# Patient Record
Sex: Female | Born: 1970 | Race: Black or African American | Hispanic: No | Marital: Single | Smoking: Former smoker
Health system: Southern US, Community
[De-identification: ages and names within clinical notes are randomized; demographics above are authoritative.]

## PROBLEM LIST (undated history)

## (undated) DIAGNOSIS — N920 Excessive and frequent menstruation with regular cycle: Secondary | ICD-10-CM

## (undated) HISTORY — PX: WISDOM TOOTH EXTRACTION: SHX21

## (undated) HISTORY — PX: TONSILLECTOMY: SUR1361

## (undated) HISTORY — PX: KNEE SURGERY: SHX244

## (undated) HISTORY — PX: TUBAL LIGATION: SHX77

---

## 1998-10-18 ENCOUNTER — Emergency Department (HOSPITAL_COMMUNITY): Admission: EM | Admit: 1998-10-18 | Discharge: 1998-10-18 | Payer: Self-pay | Admitting: Emergency Medicine

## 2000-03-28 ENCOUNTER — Encounter: Payer: Self-pay | Admitting: Family Medicine

## 2000-03-28 ENCOUNTER — Encounter: Admission: RE | Admit: 2000-03-28 | Discharge: 2000-03-28 | Payer: Self-pay | Admitting: Family Medicine

## 2000-05-02 ENCOUNTER — Emergency Department (HOSPITAL_COMMUNITY): Admission: EM | Admit: 2000-05-02 | Discharge: 2000-05-02 | Payer: Self-pay | Admitting: Emergency Medicine

## 2001-01-08 ENCOUNTER — Ambulatory Visit (HOSPITAL_COMMUNITY): Admission: RE | Admit: 2001-01-08 | Discharge: 2001-01-08 | Payer: Self-pay | Admitting: *Deleted

## 2001-02-09 ENCOUNTER — Ambulatory Visit (HOSPITAL_COMMUNITY): Admission: RE | Admit: 2001-02-09 | Discharge: 2001-02-09 | Payer: Self-pay | Admitting: *Deleted

## 2001-05-02 ENCOUNTER — Inpatient Hospital Stay (HOSPITAL_COMMUNITY): Admission: AD | Admit: 2001-05-02 | Discharge: 2001-05-13 | Payer: Self-pay | Admitting: *Deleted

## 2001-05-03 ENCOUNTER — Encounter: Payer: Self-pay | Admitting: *Deleted

## 2001-05-06 ENCOUNTER — Encounter: Payer: Self-pay | Admitting: Obstetrics

## 2001-05-13 ENCOUNTER — Encounter: Payer: Self-pay | Admitting: Obstetrics

## 2001-05-15 ENCOUNTER — Inpatient Hospital Stay (HOSPITAL_COMMUNITY): Admission: AD | Admit: 2001-05-15 | Discharge: 2001-05-15 | Payer: Self-pay | Admitting: *Deleted

## 2001-05-21 ENCOUNTER — Encounter (HOSPITAL_COMMUNITY): Admission: RE | Admit: 2001-05-21 | Discharge: 2001-06-20 | Payer: Self-pay | Admitting: Obstetrics & Gynecology

## 2001-05-23 ENCOUNTER — Encounter: Admission: RE | Admit: 2001-05-23 | Discharge: 2001-05-23 | Payer: Self-pay | Admitting: Obstetrics & Gynecology

## 2001-05-31 ENCOUNTER — Encounter: Admission: RE | Admit: 2001-05-31 | Discharge: 2001-05-31 | Payer: Self-pay | Admitting: Obstetrics

## 2001-06-04 ENCOUNTER — Inpatient Hospital Stay (HOSPITAL_COMMUNITY): Admission: AD | Admit: 2001-06-04 | Discharge: 2001-06-04 | Payer: Self-pay | Admitting: Obstetrics & Gynecology

## 2001-06-16 ENCOUNTER — Encounter: Payer: Self-pay | Admitting: Obstetrics & Gynecology

## 2001-06-16 ENCOUNTER — Inpatient Hospital Stay (HOSPITAL_COMMUNITY): Admission: AD | Admit: 2001-06-16 | Discharge: 2001-06-16 | Payer: Self-pay | Admitting: Obstetrics & Gynecology

## 2001-06-18 ENCOUNTER — Encounter: Payer: Self-pay | Admitting: Obstetrics & Gynecology

## 2001-06-21 ENCOUNTER — Inpatient Hospital Stay (HOSPITAL_COMMUNITY): Admission: AD | Admit: 2001-06-21 | Discharge: 2001-06-21 | Payer: Self-pay | Admitting: Obstetrics

## 2001-06-23 ENCOUNTER — Inpatient Hospital Stay (HOSPITAL_COMMUNITY): Admission: AD | Admit: 2001-06-23 | Discharge: 2001-06-23 | Payer: Self-pay | Admitting: *Deleted

## 2001-06-25 ENCOUNTER — Inpatient Hospital Stay (HOSPITAL_COMMUNITY): Admission: AD | Admit: 2001-06-25 | Discharge: 2001-06-27 | Payer: Self-pay | Admitting: Obstetrics

## 2001-06-25 ENCOUNTER — Inpatient Hospital Stay (HOSPITAL_COMMUNITY): Admission: AD | Admit: 2001-06-25 | Discharge: 2001-06-25 | Payer: Self-pay | Admitting: *Deleted

## 2001-06-25 ENCOUNTER — Encounter: Payer: Self-pay | Admitting: *Deleted

## 2001-06-25 ENCOUNTER — Encounter (INDEPENDENT_AMBULATORY_CARE_PROVIDER_SITE_OTHER): Payer: Self-pay

## 2001-12-19 ENCOUNTER — Emergency Department (HOSPITAL_COMMUNITY): Admission: EM | Admit: 2001-12-19 | Discharge: 2001-12-20 | Payer: Self-pay | Admitting: Emergency Medicine

## 2002-01-15 ENCOUNTER — Emergency Department (HOSPITAL_COMMUNITY): Admission: EM | Admit: 2002-01-15 | Discharge: 2002-01-15 | Payer: Self-pay | Admitting: Emergency Medicine

## 2002-03-13 ENCOUNTER — Emergency Department (HOSPITAL_COMMUNITY): Admission: EM | Admit: 2002-03-13 | Discharge: 2002-03-13 | Payer: Self-pay | Admitting: Emergency Medicine

## 2003-03-10 ENCOUNTER — Emergency Department (HOSPITAL_COMMUNITY): Admission: EM | Admit: 2003-03-10 | Discharge: 2003-03-10 | Payer: Self-pay | Admitting: Emergency Medicine

## 2003-03-10 ENCOUNTER — Encounter: Payer: Self-pay | Admitting: Emergency Medicine

## 2003-07-09 ENCOUNTER — Emergency Department (HOSPITAL_COMMUNITY): Admission: EM | Admit: 2003-07-09 | Discharge: 2003-07-09 | Payer: Self-pay | Admitting: Emergency Medicine

## 2003-08-15 ENCOUNTER — Emergency Department (HOSPITAL_COMMUNITY): Admission: EM | Admit: 2003-08-15 | Discharge: 2003-08-15 | Payer: Self-pay | Admitting: Emergency Medicine

## 2003-09-11 ENCOUNTER — Emergency Department (HOSPITAL_COMMUNITY): Admission: EM | Admit: 2003-09-11 | Discharge: 2003-09-11 | Payer: Self-pay | Admitting: Emergency Medicine

## 2003-09-30 ENCOUNTER — Emergency Department (HOSPITAL_COMMUNITY): Admission: EM | Admit: 2003-09-30 | Discharge: 2003-10-01 | Payer: Self-pay | Admitting: Emergency Medicine

## 2003-11-14 ENCOUNTER — Emergency Department (HOSPITAL_COMMUNITY): Admission: EM | Admit: 2003-11-14 | Discharge: 2003-11-15 | Payer: Self-pay | Admitting: *Deleted

## 2003-11-27 ENCOUNTER — Ambulatory Visit (HOSPITAL_COMMUNITY): Admission: RE | Admit: 2003-11-27 | Discharge: 2003-11-27 | Payer: Self-pay | Admitting: Sports Medicine

## 2004-03-23 ENCOUNTER — Encounter: Admission: RE | Admit: 2004-03-23 | Discharge: 2004-05-05 | Payer: Self-pay | Admitting: Orthopedic Surgery

## 2004-03-29 ENCOUNTER — Emergency Department (HOSPITAL_COMMUNITY): Admission: EM | Admit: 2004-03-29 | Discharge: 2004-03-30 | Payer: Self-pay | Admitting: Emergency Medicine

## 2004-09-20 ENCOUNTER — Inpatient Hospital Stay (HOSPITAL_COMMUNITY): Admission: AD | Admit: 2004-09-20 | Discharge: 2004-09-20 | Payer: Self-pay | Admitting: Obstetrics & Gynecology

## 2004-12-06 ENCOUNTER — Emergency Department (HOSPITAL_COMMUNITY): Admission: EM | Admit: 2004-12-06 | Discharge: 2004-12-07 | Payer: Self-pay | Admitting: Emergency Medicine

## 2005-03-04 ENCOUNTER — Observation Stay (HOSPITAL_COMMUNITY): Admission: AD | Admit: 2005-03-04 | Discharge: 2005-03-05 | Payer: Self-pay | Admitting: Obstetrics

## 2005-03-07 ENCOUNTER — Emergency Department (HOSPITAL_COMMUNITY): Admission: EM | Admit: 2005-03-07 | Discharge: 2005-03-07 | Payer: Self-pay | Admitting: Emergency Medicine

## 2005-06-16 ENCOUNTER — Emergency Department (HOSPITAL_COMMUNITY): Admission: EM | Admit: 2005-06-16 | Discharge: 2005-06-16 | Payer: Self-pay | Admitting: Family Medicine

## 2005-06-18 ENCOUNTER — Emergency Department (HOSPITAL_COMMUNITY): Admission: EM | Admit: 2005-06-18 | Discharge: 2005-06-18 | Payer: Self-pay | Admitting: Family Medicine

## 2005-07-07 ENCOUNTER — Emergency Department (HOSPITAL_COMMUNITY): Admission: EM | Admit: 2005-07-07 | Discharge: 2005-07-07 | Payer: Self-pay | Admitting: Emergency Medicine

## 2005-07-17 ENCOUNTER — Emergency Department (HOSPITAL_COMMUNITY): Admission: EM | Admit: 2005-07-17 | Discharge: 2005-07-17 | Payer: Self-pay | Admitting: Family Medicine

## 2005-08-25 ENCOUNTER — Emergency Department (HOSPITAL_COMMUNITY): Admission: EM | Admit: 2005-08-25 | Discharge: 2005-08-26 | Payer: Self-pay | Admitting: *Deleted

## 2005-09-17 ENCOUNTER — Emergency Department (HOSPITAL_COMMUNITY): Admission: EM | Admit: 2005-09-17 | Discharge: 2005-09-17 | Payer: Self-pay | Admitting: Family Medicine

## 2005-10-18 ENCOUNTER — Ambulatory Visit (HOSPITAL_COMMUNITY): Admission: RE | Admit: 2005-10-18 | Discharge: 2005-10-18 | Payer: Self-pay | Admitting: Obstetrics

## 2005-11-16 ENCOUNTER — Emergency Department (HOSPITAL_COMMUNITY): Admission: EM | Admit: 2005-11-16 | Discharge: 2005-11-16 | Payer: Self-pay | Admitting: Family Medicine

## 2006-02-05 ENCOUNTER — Emergency Department (HOSPITAL_COMMUNITY): Admission: EM | Admit: 2006-02-05 | Discharge: 2006-02-05 | Payer: Self-pay | Admitting: Emergency Medicine

## 2006-04-18 ENCOUNTER — Inpatient Hospital Stay (HOSPITAL_COMMUNITY): Admission: AD | Admit: 2006-04-18 | Discharge: 2006-04-18 | Payer: Self-pay | Admitting: Obstetrics & Gynecology

## 2006-05-18 ENCOUNTER — Ambulatory Visit (HOSPITAL_COMMUNITY): Admission: RE | Admit: 2006-05-18 | Discharge: 2006-05-18 | Payer: Self-pay | Admitting: Obstetrics

## 2006-07-21 ENCOUNTER — Emergency Department (HOSPITAL_COMMUNITY): Admission: EM | Admit: 2006-07-21 | Discharge: 2006-07-21 | Payer: Self-pay | Admitting: Family Medicine

## 2006-09-18 ENCOUNTER — Ambulatory Visit (HOSPITAL_COMMUNITY): Admission: RE | Admit: 2006-09-18 | Discharge: 2006-09-18 | Payer: Self-pay | Admitting: Obstetrics

## 2006-10-09 ENCOUNTER — Emergency Department (HOSPITAL_COMMUNITY): Admission: EM | Admit: 2006-10-09 | Discharge: 2006-10-09 | Payer: Self-pay | Admitting: Family Medicine

## 2006-12-04 ENCOUNTER — Ambulatory Visit (HOSPITAL_COMMUNITY): Admission: RE | Admit: 2006-12-04 | Discharge: 2006-12-04 | Payer: Self-pay | Admitting: Obstetrics

## 2007-04-03 ENCOUNTER — Ambulatory Visit (HOSPITAL_COMMUNITY): Admission: RE | Admit: 2007-04-03 | Discharge: 2007-04-03 | Payer: Self-pay | Admitting: Obstetrics

## 2007-04-16 ENCOUNTER — Encounter: Admission: RE | Admit: 2007-04-16 | Discharge: 2007-04-16 | Payer: Self-pay | Admitting: Obstetrics

## 2007-04-28 ENCOUNTER — Emergency Department (HOSPITAL_COMMUNITY): Admission: EM | Admit: 2007-04-28 | Discharge: 2007-04-28 | Payer: Self-pay | Admitting: Family Medicine

## 2007-05-01 ENCOUNTER — Emergency Department (HOSPITAL_COMMUNITY): Admission: EM | Admit: 2007-05-01 | Discharge: 2007-05-01 | Payer: Self-pay | Admitting: Emergency Medicine

## 2007-06-02 ENCOUNTER — Emergency Department (HOSPITAL_COMMUNITY): Admission: EM | Admit: 2007-06-02 | Discharge: 2007-06-02 | Payer: Self-pay | Admitting: Emergency Medicine

## 2007-06-26 ENCOUNTER — Emergency Department (HOSPITAL_COMMUNITY): Admission: EM | Admit: 2007-06-26 | Discharge: 2007-06-26 | Payer: Self-pay | Admitting: Emergency Medicine

## 2007-07-18 ENCOUNTER — Emergency Department (HOSPITAL_COMMUNITY): Admission: EM | Admit: 2007-07-18 | Discharge: 2007-07-18 | Payer: Self-pay | Admitting: Emergency Medicine

## 2007-08-03 ENCOUNTER — Emergency Department (HOSPITAL_COMMUNITY): Admission: EM | Admit: 2007-08-03 | Discharge: 2007-08-03 | Payer: Self-pay | Admitting: Emergency Medicine

## 2007-08-24 ENCOUNTER — Emergency Department (HOSPITAL_COMMUNITY): Admission: EM | Admit: 2007-08-24 | Discharge: 2007-08-24 | Payer: Self-pay | Admitting: Emergency Medicine

## 2007-08-30 ENCOUNTER — Emergency Department (HOSPITAL_COMMUNITY): Admission: EM | Admit: 2007-08-30 | Discharge: 2007-08-30 | Payer: Self-pay | Admitting: Emergency Medicine

## 2007-08-31 ENCOUNTER — Emergency Department (HOSPITAL_COMMUNITY): Admission: EM | Admit: 2007-08-31 | Discharge: 2007-08-31 | Payer: Self-pay | Admitting: Emergency Medicine

## 2007-09-22 ENCOUNTER — Emergency Department (HOSPITAL_COMMUNITY): Admission: EM | Admit: 2007-09-22 | Discharge: 2007-09-22 | Payer: Self-pay | Admitting: Emergency Medicine

## 2007-10-09 ENCOUNTER — Emergency Department (HOSPITAL_COMMUNITY): Admission: EM | Admit: 2007-10-09 | Discharge: 2007-10-09 | Payer: Self-pay | Admitting: Emergency Medicine

## 2007-10-29 ENCOUNTER — Emergency Department (HOSPITAL_COMMUNITY): Admission: EM | Admit: 2007-10-29 | Discharge: 2007-10-30 | Payer: Self-pay | Admitting: Emergency Medicine

## 2007-10-31 ENCOUNTER — Emergency Department (HOSPITAL_COMMUNITY): Admission: EM | Admit: 2007-10-31 | Discharge: 2007-10-31 | Payer: Self-pay | Admitting: Emergency Medicine

## 2007-11-13 ENCOUNTER — Emergency Department (HOSPITAL_COMMUNITY): Admission: EM | Admit: 2007-11-13 | Discharge: 2007-11-13 | Payer: Self-pay | Admitting: Emergency Medicine

## 2007-11-17 ENCOUNTER — Emergency Department (HOSPITAL_COMMUNITY): Admission: EM | Admit: 2007-11-17 | Discharge: 2007-11-17 | Payer: Self-pay | Admitting: Emergency Medicine

## 2007-12-18 ENCOUNTER — Emergency Department (HOSPITAL_COMMUNITY): Admission: EM | Admit: 2007-12-18 | Discharge: 2007-12-18 | Payer: Self-pay | Admitting: Emergency Medicine

## 2008-01-05 ENCOUNTER — Ambulatory Visit (HOSPITAL_COMMUNITY): Admission: RE | Admit: 2008-01-05 | Discharge: 2008-01-05 | Payer: Self-pay | Admitting: Chiropractic Medicine

## 2008-01-08 ENCOUNTER — Encounter: Admission: RE | Admit: 2008-01-08 | Discharge: 2008-01-08 | Payer: Self-pay | Admitting: Chiropractic Medicine

## 2008-08-03 ENCOUNTER — Emergency Department (HOSPITAL_COMMUNITY): Admission: EM | Admit: 2008-08-03 | Discharge: 2008-08-03 | Payer: Self-pay | Admitting: Family Medicine

## 2008-10-04 ENCOUNTER — Emergency Department (HOSPITAL_COMMUNITY): Admission: EM | Admit: 2008-10-04 | Discharge: 2008-10-04 | Payer: Self-pay | Admitting: Emergency Medicine

## 2008-10-30 ENCOUNTER — Encounter: Admission: RE | Admit: 2008-10-30 | Discharge: 2008-10-30 | Payer: Self-pay | Admitting: Orthopaedic Surgery

## 2008-12-29 ENCOUNTER — Emergency Department (HOSPITAL_COMMUNITY): Admission: EM | Admit: 2008-12-29 | Discharge: 2008-12-29 | Payer: Self-pay | Admitting: Emergency Medicine

## 2009-03-01 ENCOUNTER — Emergency Department (HOSPITAL_COMMUNITY): Admission: EM | Admit: 2009-03-01 | Discharge: 2009-03-02 | Payer: Self-pay | Admitting: Emergency Medicine

## 2009-03-15 ENCOUNTER — Emergency Department (HOSPITAL_COMMUNITY): Admission: EM | Admit: 2009-03-15 | Discharge: 2009-03-16 | Payer: Self-pay | Admitting: Emergency Medicine

## 2009-03-30 ENCOUNTER — Emergency Department (HOSPITAL_COMMUNITY): Admission: EM | Admit: 2009-03-30 | Discharge: 2009-03-31 | Payer: Self-pay | Admitting: Emergency Medicine

## 2009-07-21 ENCOUNTER — Ambulatory Visit (HOSPITAL_COMMUNITY): Admission: RE | Admit: 2009-07-21 | Discharge: 2009-07-21 | Payer: Self-pay | Admitting: Obstetrics

## 2009-11-01 ENCOUNTER — Emergency Department (HOSPITAL_COMMUNITY): Admission: EM | Admit: 2009-11-01 | Discharge: 2009-11-01 | Payer: Self-pay | Admitting: Emergency Medicine

## 2009-11-13 ENCOUNTER — Encounter
Admission: RE | Admit: 2009-11-13 | Discharge: 2009-11-13 | Payer: Self-pay | Admitting: Physical Medicine & Rehabilitation

## 2010-06-14 ENCOUNTER — Emergency Department (HOSPITAL_COMMUNITY): Admission: EM | Admit: 2010-06-14 | Discharge: 2010-06-14 | Payer: Self-pay | Admitting: Emergency Medicine

## 2010-07-03 IMAGING — CR DG KNEE COMPLETE 4+V*R*
4 series · 4 of 4 positions shown · non-contrast
Comparison: none.

CLINICAL DATA: Twisted, pain, history of surgery

RIGHT KNEE - COMPLETE 4+ VIEW

[t knee ap right]
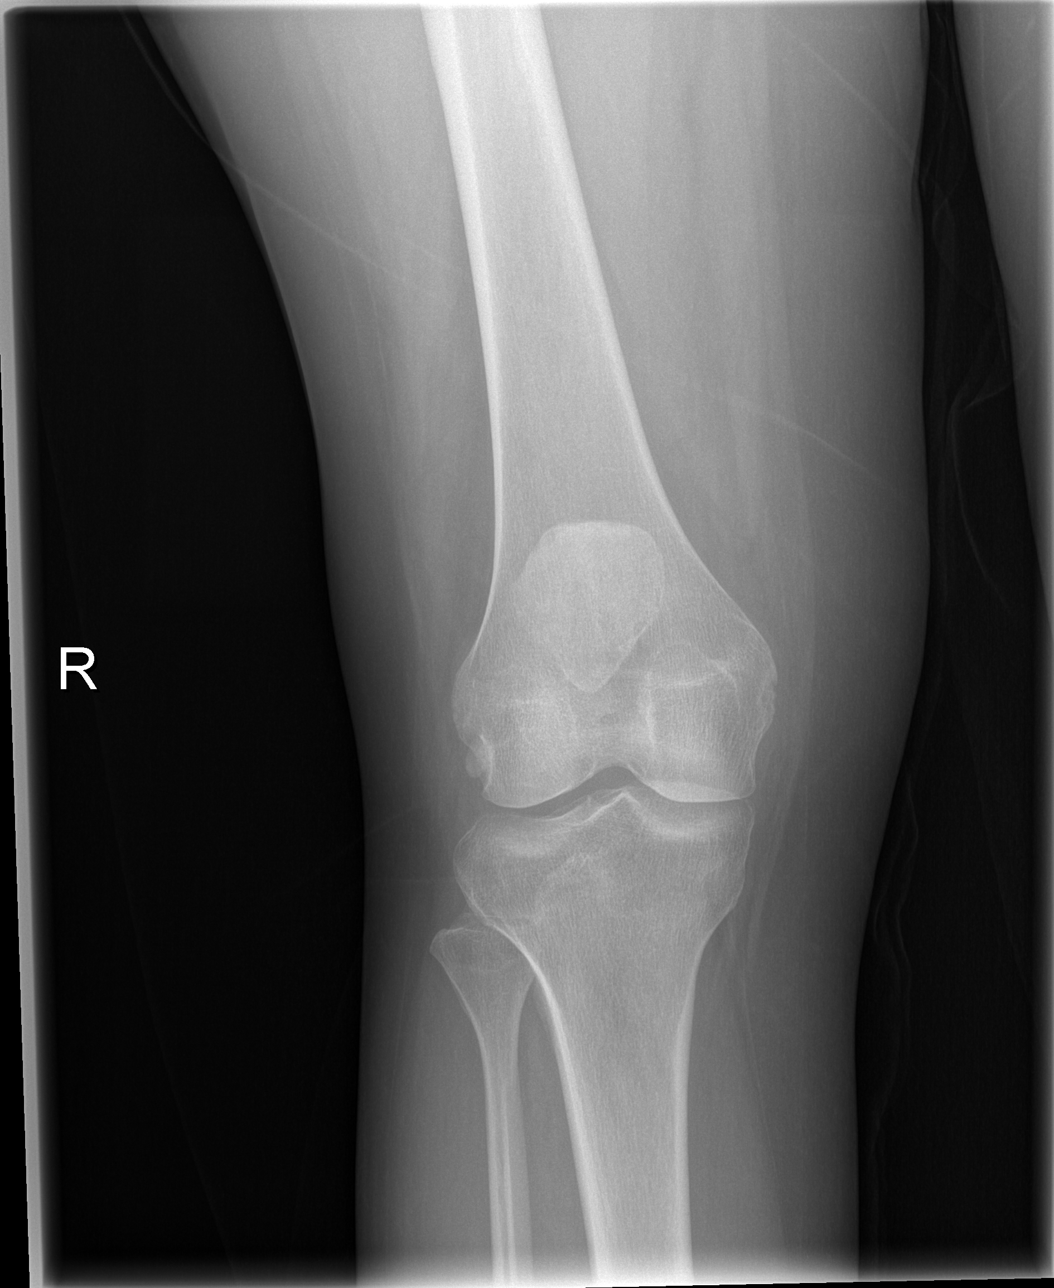

[t knee oblique right (1 of 2)]
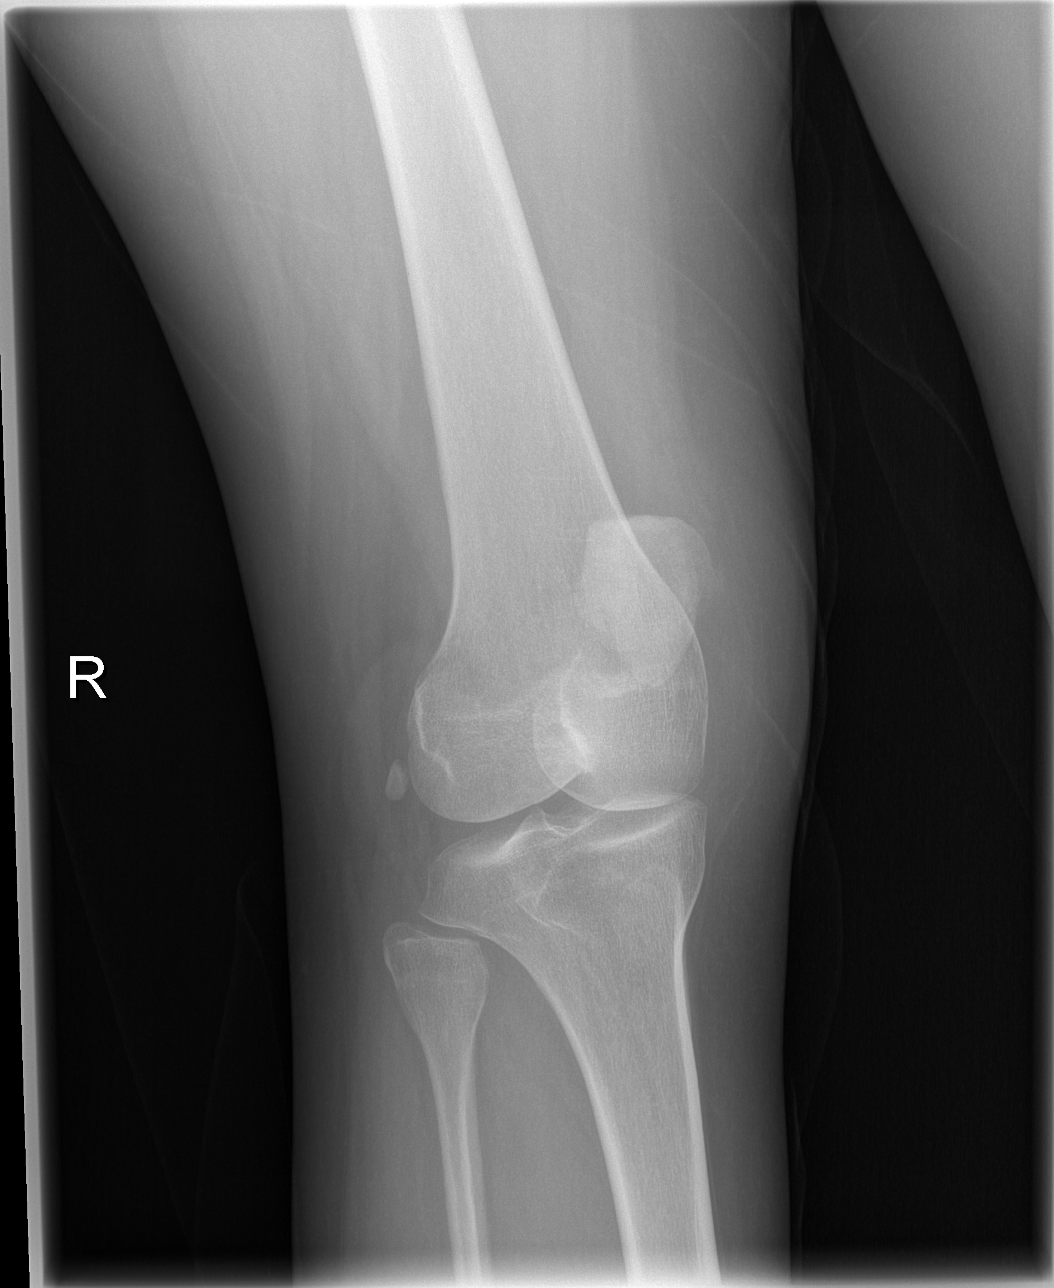

[t knee oblique right (2 of 2)]
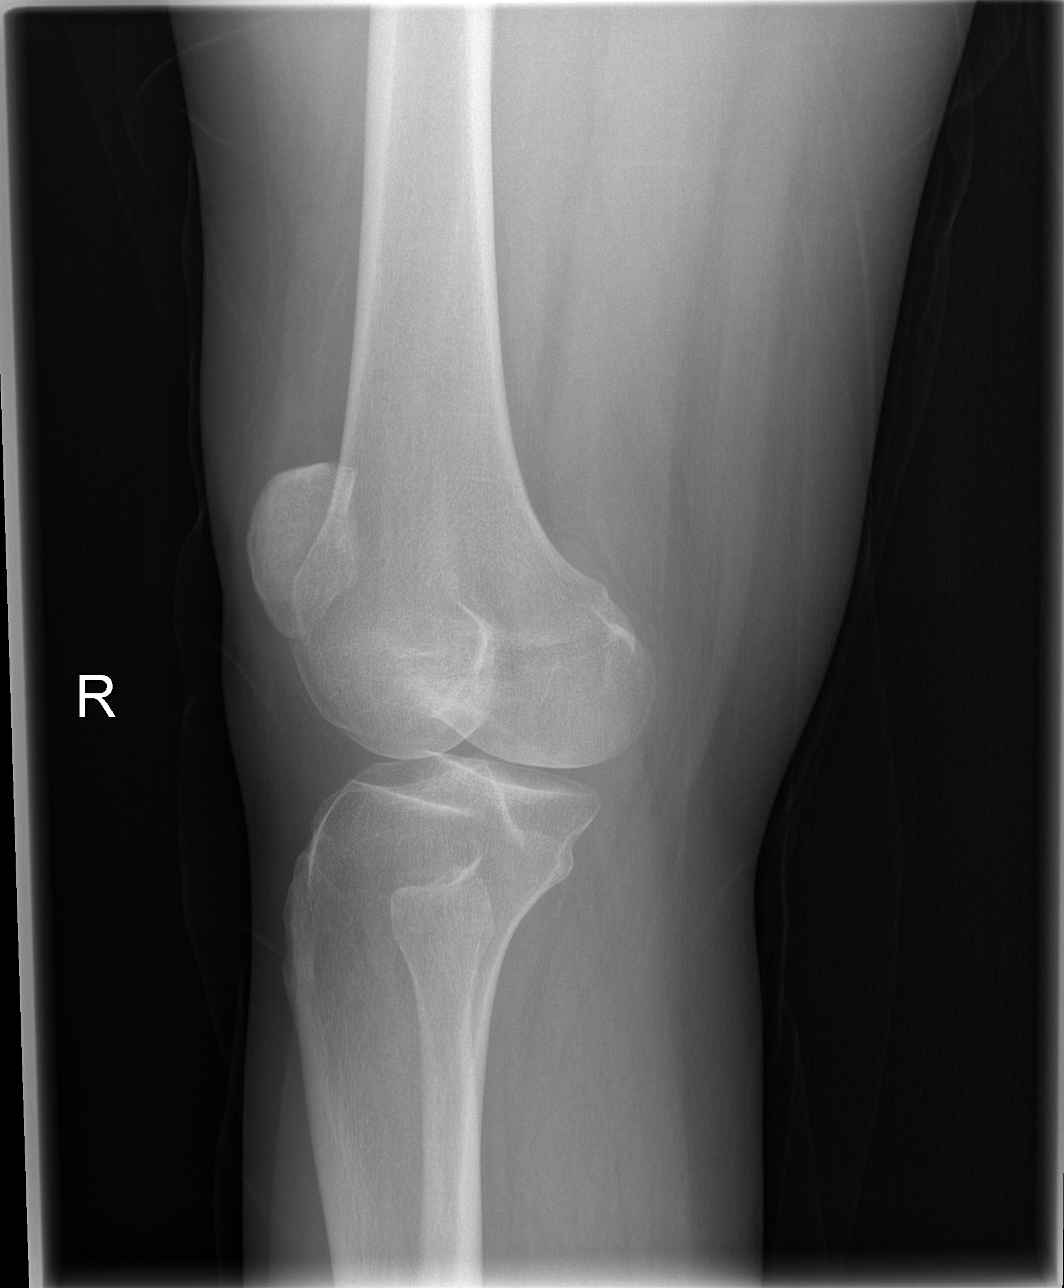

[t knee lat right]
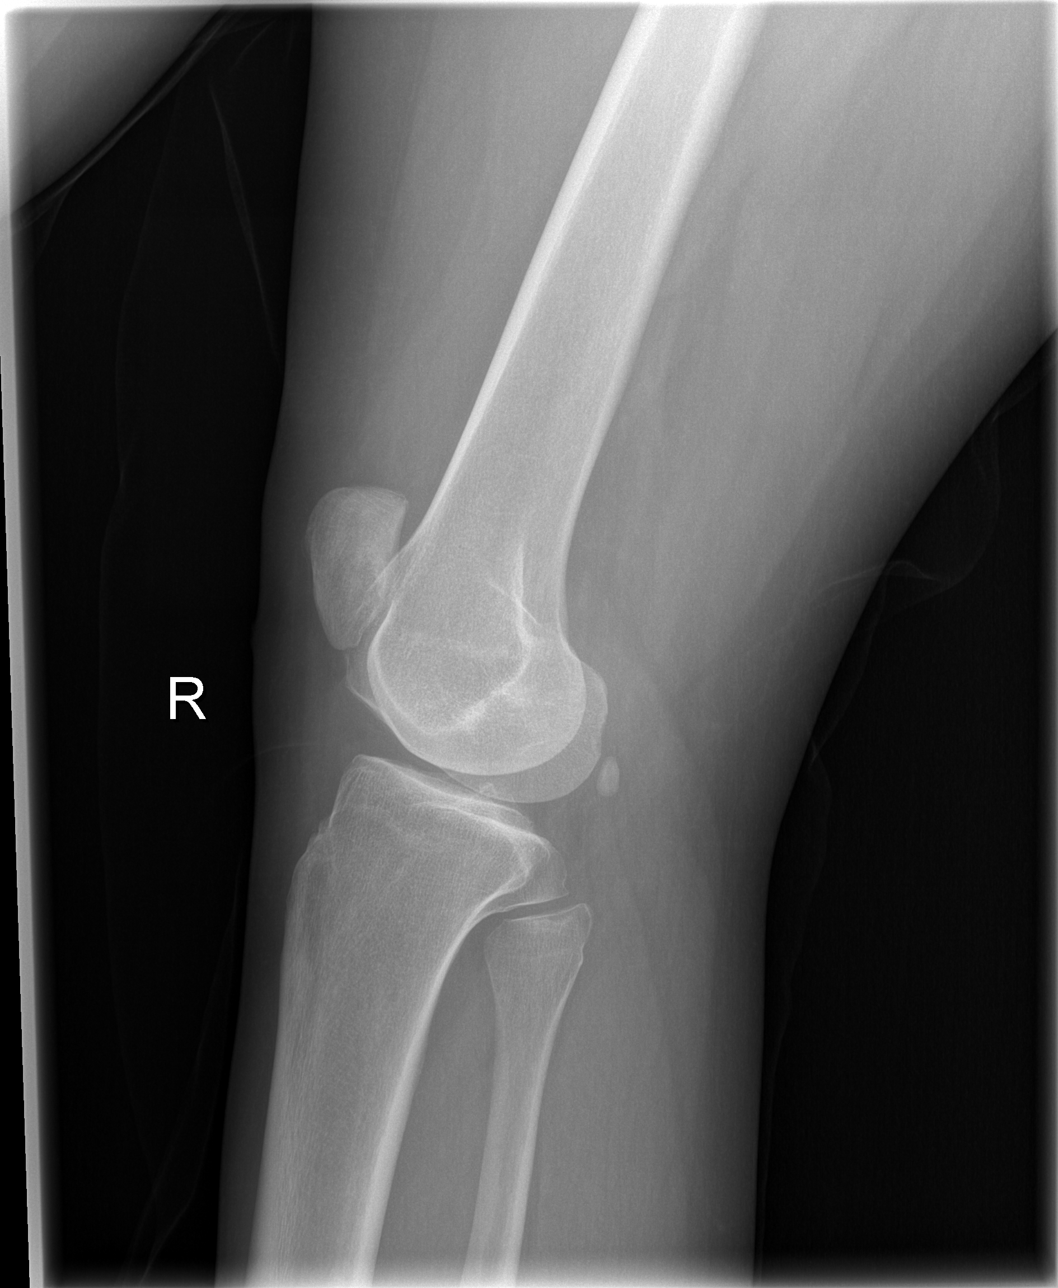

[4 of 4 positions shown; findings below may reference images not displayed]

FINDINGS: There is no evidence of fracture or dislocation.  There
is no evidence of arthropathy or other focal bony abnormality.
Soft tissues are unremarkable.An effusion is present
IMPRESSION: Knee effusion, no visible bony abnormality.

## 2010-09-18 IMAGING — CT CT PELVIS W/ CM
2 of 4 series · 17 of 46 positions shown, 19 images · IV contrast (agent unspecified)
Comparison: 03/07/2005

CT ABDOMEN

CLINICAL DATA: Abdominal pain cramping nausea, vomiting, diarrhea

CT ABDOMEN AND PELVIS WITH CONTRAST
TECHNIQUE: Multidetector CT imaging of the abdomen and pelvis was
performed using the standard protocol following bolus
administration of intravenous contrast. Breast shield utilized.
Sagittal and coronal MPR images reconstructed from axial data set.
Contrast: Water as oral contrast; 100 ml [id number] IV

[Series 2: rtn ap with st · axial · 0.66mm/px · z∈[-385,-15]mm · 14 of 82 slices shown, 16 images]
[im 4/82  soft-tissue]
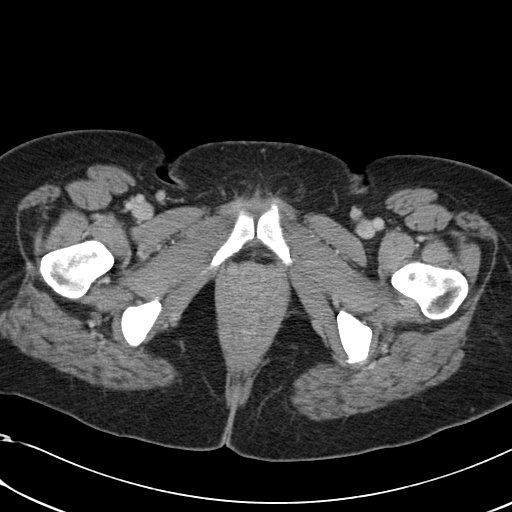
[im 4/82  bone]
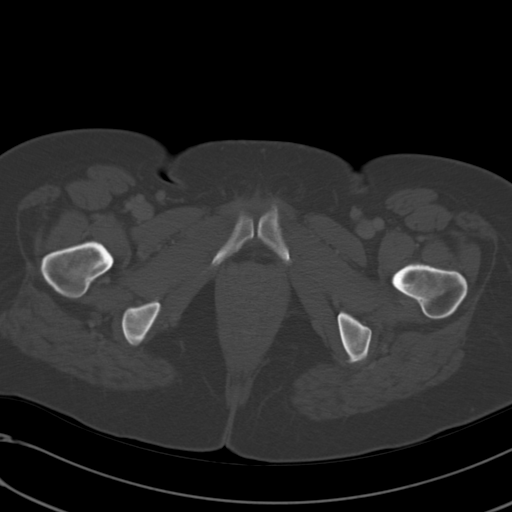
[im 10/82  soft-tissue]
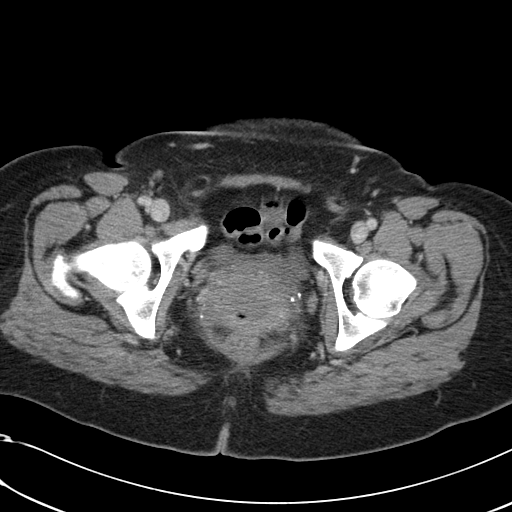
[im 17/82  soft-tissue]
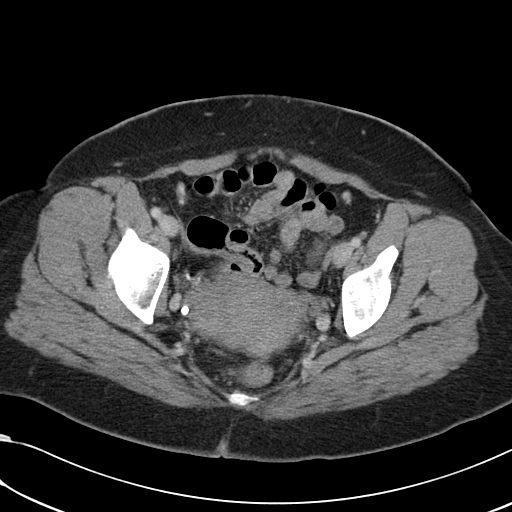
[im 23/82  soft-tissue]
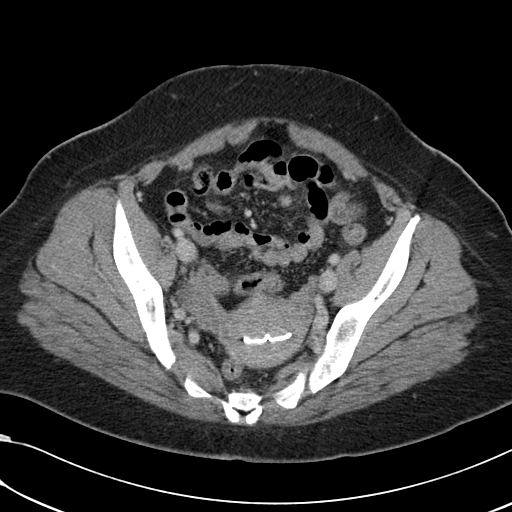
[im 26/82  soft-tissue]
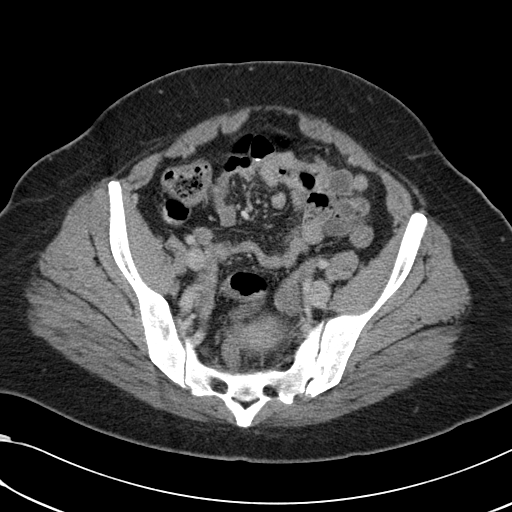
[im 33/82  soft-tissue]
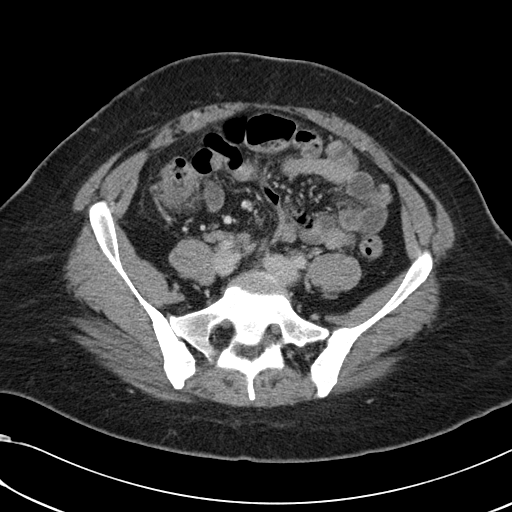
[im 39/82  soft-tissue]
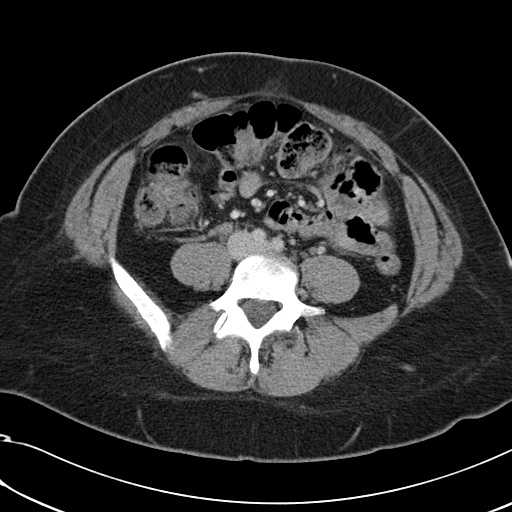
[im 43/82  soft-tissue]
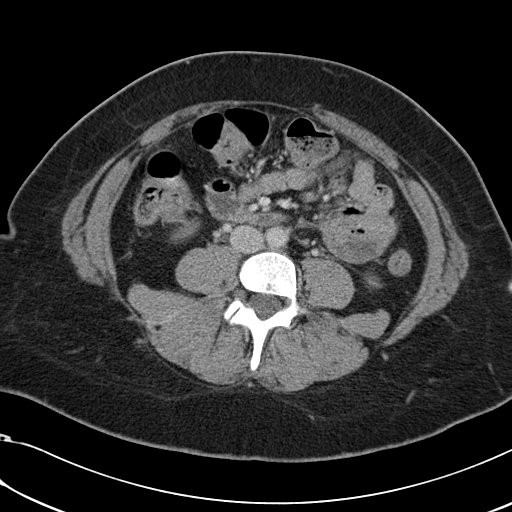
[im 49/82  soft-tissue]
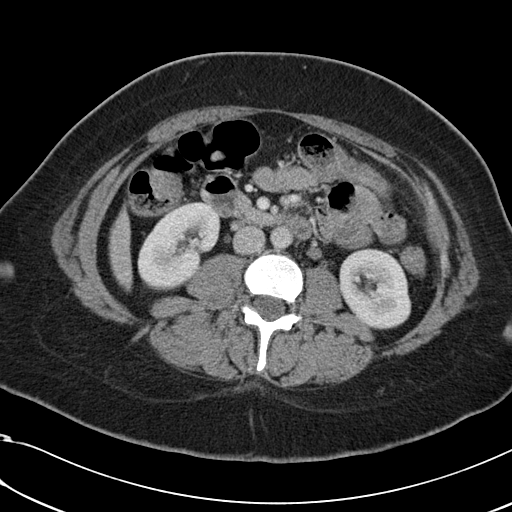
[im 49/82  bone]
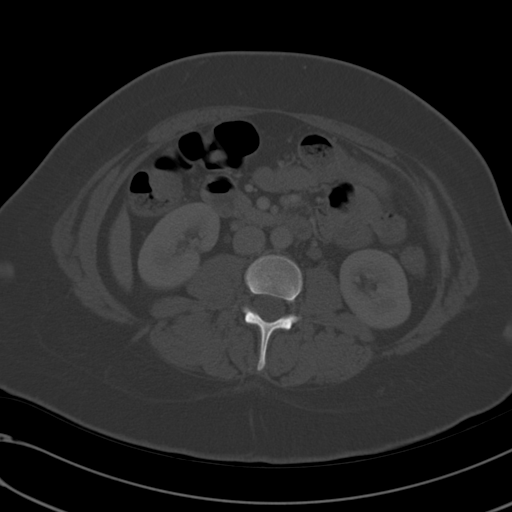
[im 56/82  soft-tissue]
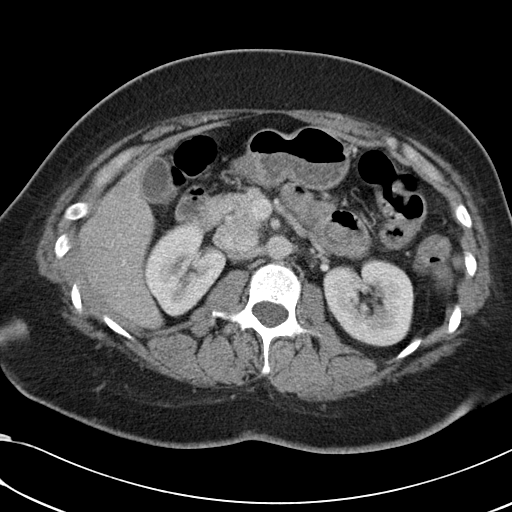
[im 62/82  soft-tissue]
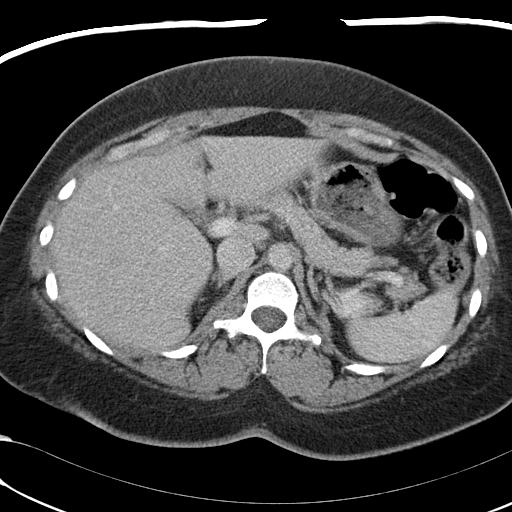
[im 65/82  soft-tissue]
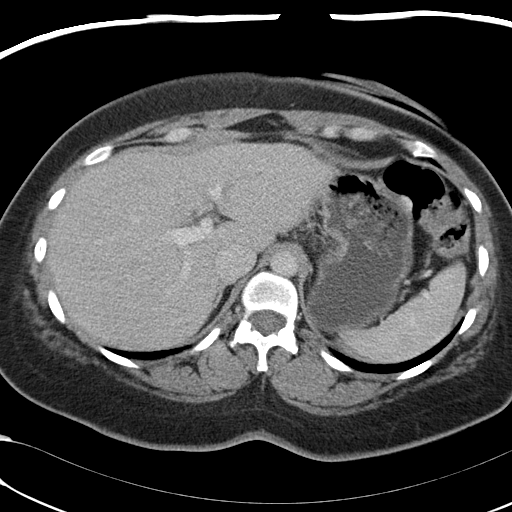
[im 72/82  soft-tissue]
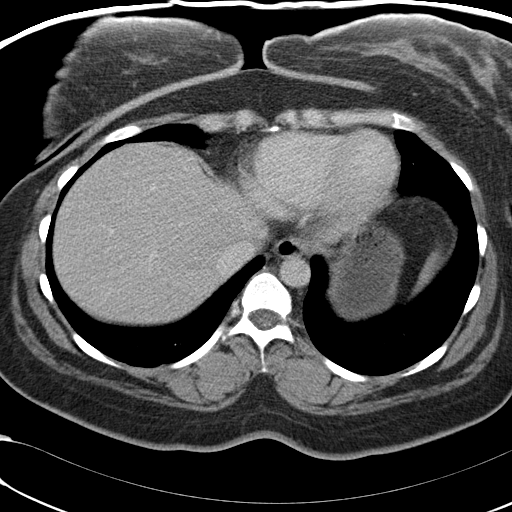
[im 78/82  soft-tissue]
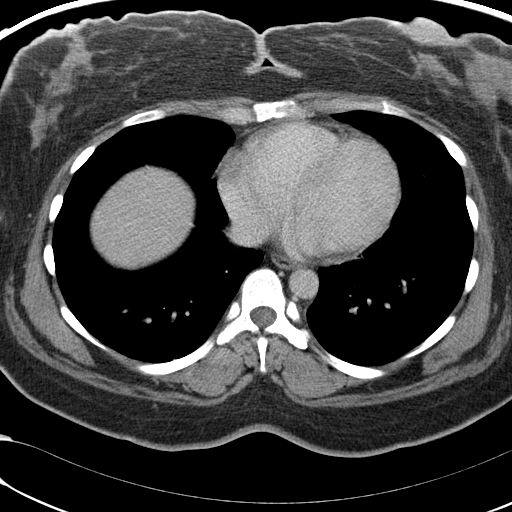

[Series 602: <mpr thick range> · coronal · 0.83mm/px · 3 of 76 slices shown]
[im 26/76  soft-tissue]
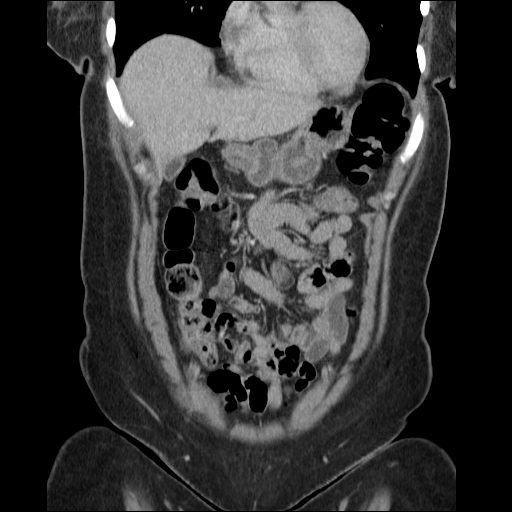
[im 34/76  soft-tissue]
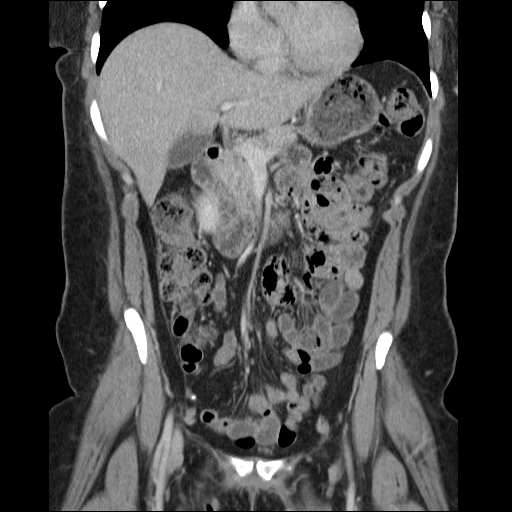
[im 42/76  soft-tissue]
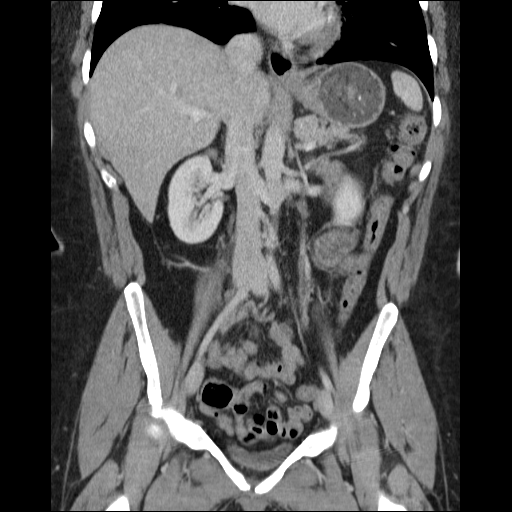

[17 of 46 positions shown; findings below may reference images not displayed]

FINDINGS: Lung bases clear.
Liver, spleen, pancreas, kidneys, and adrenal glands normal
appearance.
Retroaortic left renal vein noted.
Stomach and upper abdominal bowel loops suboptimally assessed,, no
gross abnormalities seen.
No upper abdominal mass, adenopathy, free fluid, or inflammatory
process.
IMPRESSION: No acute upper abdominal abnormalities.

CT PELVIS
FINDINGS: Small umbilical hernia containing fat.
Appendix normal size containing an appendicolith.
IUD within uterus.
Bladder decompressed.
Adnexae and pelvic bowel loops grossly normal.
No pelvic mass, adenopathy, free fluid, or inflammatory process.
IMPRESSION: No acute intrapelvic abnormalities.
Small umbilical hernia containing fat.

## 2010-10-03 ENCOUNTER — Encounter: Payer: Self-pay | Admitting: Obstetrics

## 2010-10-03 ENCOUNTER — Encounter: Payer: Self-pay | Admitting: Orthopaedic Surgery

## 2010-10-04 ENCOUNTER — Encounter: Payer: Self-pay | Admitting: Obstetrics

## 2010-11-10 ENCOUNTER — Other Ambulatory Visit: Payer: Self-pay | Admitting: Orthopaedic Surgery

## 2010-11-10 DIAGNOSIS — M549 Dorsalgia, unspecified: Secondary | ICD-10-CM

## 2010-11-12 ENCOUNTER — Other Ambulatory Visit: Payer: Self-pay

## 2010-11-23 ENCOUNTER — Other Ambulatory Visit: Payer: Self-pay | Admitting: Orthopaedic Surgery

## 2010-11-23 DIAGNOSIS — M545 Low back pain: Secondary | ICD-10-CM

## 2010-11-25 ENCOUNTER — Other Ambulatory Visit: Payer: Self-pay

## 2010-12-01 LAB — URINALYSIS, ROUTINE W REFLEX MICROSCOPIC
Glucose, UA: NEGATIVE mg/dL
Nitrite: NEGATIVE
Specific Gravity, Urine: 1.026 (ref 1.005–1.030)
pH: 5.5 (ref 5.0–8.0)

## 2010-12-06 ENCOUNTER — Other Ambulatory Visit: Payer: Self-pay

## 2010-12-19 LAB — COMPREHENSIVE METABOLIC PANEL
Albumin: 4.4 g/dL (ref 3.5–5.2)
BUN: 5 mg/dL — ABNORMAL LOW (ref 6–23)
Calcium: 10 mg/dL (ref 8.4–10.5)
Creatinine, Ser: 0.86 mg/dL (ref 0.4–1.2)
Glucose, Bld: 96 mg/dL (ref 70–99)
Potassium: 3.5 mEq/L (ref 3.5–5.1)
Total Protein: 7.3 g/dL (ref 6.0–8.3)

## 2010-12-19 LAB — CBC
HCT: 41 % (ref 36.0–46.0)
MCHC: 32.8 g/dL (ref 30.0–36.0)
MCV: 93.6 fL (ref 78.0–100.0)
Platelets: 279 10*3/uL (ref 150–400)
RDW: 13.1 % (ref 11.5–15.5)

## 2010-12-19 LAB — DIFFERENTIAL
Lymphocytes Relative: 40 % (ref 12–46)
Lymphs Abs: 2.8 10*3/uL (ref 0.7–4.0)
Monocytes Absolute: 0.5 10*3/uL (ref 0.1–1.0)
Monocytes Relative: 7 % (ref 3–12)
Neutro Abs: 3.6 10*3/uL (ref 1.7–7.7)
Neutrophils Relative %: 51 % (ref 43–77)

## 2010-12-21 ENCOUNTER — Other Ambulatory Visit: Payer: Self-pay

## 2011-01-06 ENCOUNTER — Other Ambulatory Visit: Payer: Self-pay

## 2011-01-28 NOTE — Op Note (Signed)
[facility]  Patient:    Lisa Farley, Lisa Farley Visit Number: [id number] MRN: [id number]          Service Type: OBS Location: 910A 9143 01 Attending Physician:  Tammi Sou Dictated by:   Ed Blalock. Burnadette Peter, M.D. Proc. Date: 06/26/01 Admit Date:  06/25/2001   CC:         Conni Elliot, M.D.   Operative Report  DATE OF BIRTH:                [birth year]  PREOPERATIVE DIAGNOSIS:       Multiparity, desires permanent sterilization.  POSTOPERATIVE DIAGNOSIS:      Multiparity, desires permanent sterilization.  PROCEDURE:                    Postpartum tubal ligation.  SURGEONS:                     1. Ed Blalock. Burnadette Peter, M.D.                               2. Conni Elliot, M.D.  ANESTHESIA:                   Epidural.  COMPLICATIONS:                None.  ESTIMATED BLOOD LOSS:         Minimal.  FLUIDS:                       500 cc LR.  INDICATIONS:                  This is a 40 year old G5, P2-0-2-2 status post ______ who desires permanent sterilization.  The risks and benefits of the procedure were discussed with the patient including the possibility of failure and increased risk of ectopic gestation if pregnancy occurs.  FINDINGS:                     Normal uterus, tubes and ovaries.  DESCRIPTION OF PROCEDURE:     The patient was taken to the OR, where her epidural was found to be adequate.  A small transverse infraumbilical skin incision was made with a scalpel.  The incision was carried down through the underlying fascia until the peritoneum was identified and entered.  The peritoneum was noted to be free of any adhesions.  The incision was extended with Metzenbaum scissors.  The patients left tube was then identified, brought to the incision and grasped with a Babcock clamp.  He tube was then followed out to the fimbria.  The Tanja Port was used to grasp approximately 4 cm from the cornual region.  A segment of tube was then  ligated with a 2-0 chromic gut on an SH needle.  A #1 tie was then put on from proximal ligation and the portion of tube was excised.  Good hemostasis was noted.  The tube was returned to the abdomen.  The right fallopian tube was then ligated and a portion excised in a similar fashion.  Excellent hemostasis was noted and the tube was returned to the abdomen.  The peritoneum and fascia were then closed, the peritoneum using 3-0 Monocryl, the fascia using 0 Vicryl.  A subcutaneous stitch was placed using 3-0 Monocryl and a subcuticular stitch was placed using 4-0 Vicryl.  The  patient tolerated the procedure well.  Sponge, lap, and needle counts were correct x 2.  The patient was taken to the recovery room in stable condition. Dictated by:   Ed Blalock. Burnadette Peter, M.D. Attending Physician:  Tammi Sou DD:  06/26/01 TD:  06/26/01 Job: [phone]

## 2011-01-28 NOTE — Discharge Summary (Signed)
[facility]  Patient:    Lisa Farley, Lisa Farley Visit Number: [id number] MRN: [id number]          Service Type: OBS Location: 910A 9140 01 Attending Physician:  Tammi Sou Dictated by:   Gwenlyn Perking, M.D. Admit Date:  05/02/2001 Discharge Date: 05/13/2001                             Discharge Summary  ADMISSION DIAGNOSIS:          A 40 year old, G5, P2-0-2-2 at 31 weeks 5 days with preterm contractions and possible premature prolonged rupture of membranes.  DISCHARGE DIAGNOSIS:          Intrauterine pregnancy at 33 weeks 1 day with probable sealed leak or high leak resolved.  SERVICE:                      OB Teaching Service.  ATTENDING:                    Bing Neighbors. Clearance Coots, M.D.  RESIDENT:                     Gwenlyn Perking, M.D.  HISTORY AND PHYSICAL EXAMINATION:                  See admission H&P.  HOSPITAL COURSE:              The patient is a ___ -year-old, G5, P2-0-2-2 who presented to the Maternity Admission Unit on May 02, 2001 at 31 weeks 5 days intrauterine pregnancy with some preterm contractions. The patient was also found to have a positive pool with Nitrazine and ferning and was therefore admitted for probable PPROM, and placed on IV antibiotics, Unasyn. The patient also received dexamethasone. Ultrasound on presentation revealed her AFI to be decreased at 8.4 cm. This was monitored while the patient was hospitalized. On August 25 it decreased to 7.4 cm and on September 1 it was back up to 9.8 cm. The patient, throughout her hospitalization, denied any history of leaking of amniotic fluid. The patients antibiotic was continued. The patient was monitored and only occasional uterine irritability was identified on the tocodynamometer. The fetal heart rate was reassuring with accelerations, no decelerations and baseline of 130, and after her whole situation was discussed with the patient and potential risks and  benefits had been explained to the patient, it was decided that she had benefited maximally from this hospital admission and could be discharged home safely. This was discussed with Dr. Coral Ceo.  DISCHARGE CONDITION:          Stable.  DISPOSITION:                  Discharge patient home.  DISCHARGE MEDICATIONS:        1. Augmentin 875 mg one p.o. b.i.d.                               2. Ambien 10 mg p.o. q.h.s. p.r.n. sleeping                                  problems.  3. Prenatal vitamins one p.o. q.d.  DISCHARGE INSTRUCTIONS:       Activity: Strict bed rest except for using the bathroom briefly. Diet as tolerated. Symptoms to warrant further treatment: Should the patient notice any increase in frequency of contractions or leaking of some more fluid, she is to come back to Maternity Admission Unit for further evaluation and treatment.  DISCHARGE FOLLOWUP:           The patient is to follow up at [facility] on May 15, 2001 for a recheck. At that time, she will also be set up for further follow up, either at the Maternity Admission Unit or at [facility]. Dictated by:   Gwenlyn Perking, M.D. Attending Physician:  Tammi Sou DD:  05/13/01 TD:  05/13/01 Job: [phone] [id number]

## 2011-01-31 ENCOUNTER — Other Ambulatory Visit: Payer: Self-pay | Admitting: Obstetrics

## 2011-01-31 DIAGNOSIS — Z1231 Encounter for screening mammogram for malignant neoplasm of breast: Secondary | ICD-10-CM

## 2011-02-08 ENCOUNTER — Other Ambulatory Visit: Payer: Self-pay

## 2011-02-09 ENCOUNTER — Ambulatory Visit: Payer: Self-pay

## 2011-04-17 ENCOUNTER — Encounter (HOSPITAL_COMMUNITY): Payer: Self-pay | Admitting: *Deleted

## 2011-04-17 ENCOUNTER — Inpatient Hospital Stay (HOSPITAL_COMMUNITY): Payer: Medicaid Other

## 2011-04-17 ENCOUNTER — Inpatient Hospital Stay (HOSPITAL_COMMUNITY)
Admission: AD | Admit: 2011-04-17 | Discharge: 2011-04-17 | Disposition: A | Discharge status: Home / Self Care | Payer: Medicaid Other | Source: Ambulatory Visit | Attending: Obstetrics | Admitting: Obstetrics

## 2011-04-17 DIAGNOSIS — A499 Bacterial infection, unspecified: Secondary | ICD-10-CM | POA: Insufficient documentation

## 2011-04-17 DIAGNOSIS — N760 Acute vaginitis: Secondary | ICD-10-CM | POA: Insufficient documentation

## 2011-04-17 DIAGNOSIS — N949 Unspecified condition associated with female genital organs and menstrual cycle: Secondary | ICD-10-CM

## 2011-04-17 DIAGNOSIS — B9689 Other specified bacterial agents as the cause of diseases classified elsewhere: Secondary | ICD-10-CM | POA: Insufficient documentation

## 2011-04-17 DIAGNOSIS — R1032 Left lower quadrant pain: Secondary | ICD-10-CM | POA: Insufficient documentation

## 2011-04-17 DIAGNOSIS — R102 Pelvic and perineal pain: Secondary | ICD-10-CM

## 2011-04-17 HISTORY — DX: Excessive and frequent menstruation with regular cycle: N92.0

## 2011-04-17 LAB — URINE MICROSCOPIC-ADD ON

## 2011-04-17 LAB — URINALYSIS, ROUTINE W REFLEX MICROSCOPIC
Glucose, UA: NEGATIVE mg/dL
Leukocytes, UA: NEGATIVE
Protein, ur: NEGATIVE mg/dL
Specific Gravity, Urine: 1.025 (ref 1.005–1.030)

## 2011-04-17 LAB — WET PREP, GENITAL: Yeast Wet Prep HPF POC: NONE SEEN

## 2011-04-17 LAB — POCT PREGNANCY, URINE: Preg Test, Ur: NEGATIVE

## 2011-04-17 MED ORDER — METRONIDAZOLE 500 MG PO TABS: 500.0000 mg | ORAL_TABLET | Freq: Two times a day (BID) | ORAL | Status: AC

## 2011-04-17 MED ORDER — ACETAMINOPHEN-CODEINE 300-30 MG PO TABS: 1.0000 | ORAL_TABLET | ORAL | Status: AC | PRN

## 2011-04-17 MED ORDER — KETOROLAC TROMETHAMINE 60 MG/2ML IM SOLN
60.0000 mg | Freq: Once | INTRAMUSCULAR | Status: AC
  Administered 2011-04-17: 60 mg via INTRAMUSCULAR
  Filled 2011-04-17: qty 2

## 2011-04-17 NOTE — ED Provider Notes (Signed)
History     Chief Complaint  Patient presents with  . Abdominal Pain   HPI Pt is here with report of bilateral cramping x 2 days;  Pain is increasing and worse in the LLQ; Required to use a pad today.  Pain is rated 8/10.  Cycle normally occurs every 3 months with bleeding x 1 day. +IUD for history of menorrhagia.  Past Medical History  Diagnosis Date  . Menorrhagia     Past Surgical History  Procedure Date  . Tubal ligation   . Knee surgery   . Tonsillectomy   . Wisdom tooth extraction     Family History  Problem Relation Age of Onset  . Arthritis Mother   . Asthma Mother   . Hypertension Mother   . Asthma Son     History  Substance Use Topics  . Smoking status: Current Everyday Smoker -- 0.2 packs/day for 1 years    Types: Cigarettes  . Smokeless tobacco: Not on file  . Alcohol Use: 1.2 oz/week    2 Cans of beer per week    Allergies: No Known Allergies  Prescriptions prior to admission  Medication Sig Dispense Refill  . acetaminophen (TYLENOL) 325 MG tablet Take 650 mg by mouth every 6 (six) hours as needed. For cramping       . PRESCRIPTION MEDICATION Place 1-2 drops into both eyes daily as needed. For allergies         Review of Systems  Gastrointestinal: Positive for abdominal pain. Negative for nausea and vomiting.  Genitourinary: Negative for dysuria.  All other systems reviewed and are negative.   Physical Exam   Blood pressure 139/81, pulse 80, temperature 98.4 F (36.9 C), temperature source Oral, resp. rate 18, height 5\' 5"  (1.651 m), weight 85.276 kg (188 lb), last menstrual period 04/13/2011.  Physical Exam  Constitutional: She is oriented to person, place, and time. She appears well-developed and well-nourished.  HENT:  Head: Normocephalic.  Neck: Normal range of motion. Neck supple.  Cardiovascular: Normal rate, regular rhythm and normal heart sounds.   Respiratory: Effort normal and breath sounds normal.  GI: Soft. She exhibits no  mass. There is tenderness. There is no guarding.  Genitourinary: Right adnexum displays no mass. Left adnexum displays tenderness. Left adnexum displays no mass. Vaginal discharge (white, creamy) found.       Negative cervical motion tenderness; IUD strings seen.  Neurological: She is alert and oriented to person, place, and time. She has normal reflexes.  Skin: Skin is warm and dry.    MAU Course  Procedures Ultrasound - normal Wet Prep - few clue  Assessment and Plan  Pelvic Pain Bacterial Vaginosis  RX Flagyl Follow-up with Dr. Clearance Coots.  [facility] 04/17/2011, 9:42 PM

## 2011-04-17 NOTE — Progress Notes (Signed)
Pt reports "i am having a lot of pain in my lower stomach and both sides, on the left more than right" x 2 days, pt has iud x 4 yrs. States she was told 2 years ago that she had ovarian cysts. Denies dysuria, last vaginal bleeding 6 days ago, has had irregular bleeding since IUD

## 2011-04-17 NOTE — Discharge Instructions (Signed)
Bacterial Vaginosis (BV)  Bacterial vaginosis (BV) is a vaginal infection where the normal balance of bacteria in the vagina is disrupted. The normal balance is then replaced by an overgrowth of certain bacteria. There are several different kinds of bacteria that can cause BV. BV is the most common vaginal infection in women of childbearing age.  CAUSES   The cause of BV is not fully understood. BV develops when there is an increase or imbalance of harmful bacteria.    Some activities or behaviors can upset the normal balance of bacteria in the vagina and put women at increased risk including:    Having a new sex partner or multiple sex partners.    Douching.    Using an intrauterine device (IUD) for contraception.    It is not clear what role sexual activity plays in the development of BV. However, women that have never had sexual intercourse are rarely infected with BV.   Women do not get BV from toilet seats, bedding, swimming pools or from touching objects around them.   SYMPTOMS   Grey vaginal discharge.    A fish like odor with discharge, especially after sexual intercourse.    Itching or burning of the vagina and vulva.    Burning or pain with urination.    Some women have no signs or symptoms at all.   DIAGNOSIS   Your caregiver must examine the vagina for signs of BV. Your caregiver will perform lab tests and look at the sample of vaginal fluid through a microscope. They will look for bacteria and abnormal cells (clue cells), a pH test higher than 4.5, and a positive amine test all associated with BV.   RISKS AND COMPLICATIONS   Pelvic inflammatory disease (PID).    Infections following gynecology surgery.    Developing HIV.    Developing herpes virus.   TREATMENT  Sometimes BV will clear up without treatment. However, all women with symptoms of BV should be treated to avoid complications, especially if gynecology surgery is planned. Female partners generally do not need to be treated. However,  BV may spread between female sex partners so treatment is helpful in preventing a recurrence of BV.    BV may be treated with medicines that kill germs (antibiotics). The antibiotics come in either pill or vaginal cream forms. Either can be used with non-pregnant or pregnant women, but the recommended dosages differ. These antibiotics are not harmful to the baby.    BV can recur after treatment. If this happens, a second course of antibiotics will often be prescribed.    Treatment is important for pregnant women. If not treated, BV can cause a premature delivery, especially for a pregnant woman who had a premature birth in the past. All pregnant women who have symptoms of BV should be checked and treated.    For chronic reoccurrence of BV, treatment with a type of prescribed gel vaginally twice a week is helpful.   HOME CARE INSTRUCTIONS   Finish all medication as directed by your caregiver.    Do not have sex until treatment is completed.    Tell your sexual partner that you have a vaginal infection. They should see their caregiver and be treated if they have problems, such as a mild rash or itching.    Practice safe sex. Use condoms. Only have one sex partner.   PREVENTION  Basic prevention steps can help reduce the risk of upsetting the natural balance of bacteria in the vagina and   developing BV:   Do not have sexual intercourse (be abstinent).    Do not douche.    Use all of the medicine prescribed for treatment of BV, even if the signs and symptoms go away.    Tell your sex partner if you have BV. That way, they can be treated, if needed, to prevent reoccurrence.   SEEK MEDICAL CARE IF:   Your symptoms are not improving after three days of treatment.    You have increased discharge, pain or fever.   MAKE SURE YOU:    Understand these instructions.    Will watch your condition.    Will get help right away if you are not doing well or get worse.   FOR MORE INFORMATION  Division of STD Prevention  (DSTDP)  Centers for Disease Control and Prevention  www.cdc.gov/std  Order Publications Online at www.cdc.gov/std/pubs/  CDC National Prevention Information Network (NPIN)  E-mail: info@cdcnpin.org  www.cdcnpin.org    American Social Health Association (ASHA)  [address] [address]  [place],  [address]  www.ashastd.org   Document Released: 08/29/2005 Document Re-Released: 11/23/2009  ExitCare Patient Information 2011 [employer].

## 2011-04-17 NOTE — Progress Notes (Signed)
Pt c/o spotting and  pain associated with cramping on the lower left side that started yesterday and has not eased up yet.  Pt took tylenol and pamprin today; pt went to sleep but woke up with the same cramping.

## 2011-04-18 LAB — GC/CHLAMYDIA PROBE AMP, GENITAL: GC Probe Amp, Genital: NEGATIVE

## 2011-06-03 LAB — COMPREHENSIVE METABOLIC PANEL
Albumin: 4.3
BUN: 4 — ABNORMAL LOW
Calcium: 10
Glucose, Bld: 102 — ABNORMAL HIGH
Total Protein: 7.7

## 2011-06-03 LAB — DIFFERENTIAL
Eosinophils Absolute: 0.1
Eosinophils Relative: 1
Lymphs Abs: 2.6
Monocytes Absolute: 0.4
Monocytes Relative: 5
Neutrophils Relative %: 63

## 2011-06-03 LAB — CBC
HCT: 40.6
Hemoglobin: 13.8
MCHC: 34
MCV: 91.1
RBC: 4.45
WBC: 8.5

## 2011-06-03 LAB — POCT PREGNANCY, URINE
Operator id: 24446
Preg Test, Ur: NEGATIVE

## 2011-06-03 LAB — URINALYSIS, ROUTINE W REFLEX MICROSCOPIC
Glucose, UA: NEGATIVE
Hgb urine dipstick: NEGATIVE
Protein, ur: NEGATIVE
Specific Gravity, Urine: 1.02
Urobilinogen, UA: 0.2

## 2011-06-21 LAB — POCT URINALYSIS DIP (DEVICE)
Glucose, UA: NEGATIVE
Hgb urine dipstick: NEGATIVE
Specific Gravity, Urine: 1.01
Urobilinogen, UA: 1
pH: 7

## 2011-06-21 LAB — WET PREP, GENITAL: Clue Cells Wet Prep HPF POC: NONE SEEN

## 2011-06-21 LAB — POCT PREGNANCY, URINE: Operator id: 247071

## 2011-06-24 LAB — POCT URINALYSIS DIP (DEVICE)
Bilirubin Urine: NEGATIVE
Glucose, UA: NEGATIVE
Hgb urine dipstick: NEGATIVE
Ketones, ur: NEGATIVE
Specific Gravity, Urine: 1.015
Urobilinogen, UA: 0.2

## 2011-07-18 ENCOUNTER — Inpatient Hospital Stay: Admission: RE | Admit: 2011-07-18 | Payer: Medicaid Other | Source: Ambulatory Visit

## 2011-07-27 ENCOUNTER — Encounter (HOSPITAL_COMMUNITY): Payer: Self-pay

## 2011-07-27 ENCOUNTER — Emergency Department (HOSPITAL_COMMUNITY)
Admission: EM | Admit: 2011-07-27 | Discharge: 2011-07-27 | Disposition: A | Discharge status: Home / Self Care | Payer: Medicaid Other | Source: Home / Self Care

## 2011-07-27 DIAGNOSIS — J209 Acute bronchitis, unspecified: Secondary | ICD-10-CM

## 2011-07-27 MED ORDER — PROMETHAZINE-CODEINE 6.25-10 MG/5ML PO SYRP: ORAL_SOLUTION | ORAL | Status: AC

## 2011-07-27 MED ORDER — AZITHROMYCIN 250 MG PO TABS: ORAL_TABLET | ORAL | Status: DC

## 2011-07-27 MED ORDER — PREDNISONE 20 MG PO TABS: 20.0000 mg | ORAL_TABLET | Freq: Two times a day (BID) | ORAL | Status: AC

## 2011-07-27 NOTE — ED Provider Notes (Signed)
Medical screening examination/treatment/procedure(s) were performed by non-physician practitioner and as supervising physician I was immediately available for consultation/collaboration.   Barkley Bruns MD.    Barkley Bruns, MD 07/27/11 2149

## 2011-07-27 NOTE — ED Notes (Signed)
C/o cough w congestion for past 6-7 days; not been able to get relief w OTC medications, had green/yellow tinted secretions; Has not had a cough like this since she had pneumonia 5-6 yrs ago

## 2011-07-27 NOTE — ED Provider Notes (Signed)
History     CSN: [id number] Arrival date & time: 07/27/2011  7:25 PM   None     Chief Complaint  Patient presents with  . Cough    (Consider location/radiation/quality/duration/timing/severity/associated sxs/prior treatment) Patient is a 40 y.o. female presenting with cough. The history is provided by the patient.  Cough This is a new problem. Episode onset: one week. The problem occurs constantly. The problem has been gradually worsening. The cough is productive of sputum (yellow phlegm, becoming more green). There has been no fever. Associated symptoms include rhinorrhea. Pertinent negatives include no chest pain, no chills, no ear pain, no sore throat, no shortness of breath and no wheezing. Associated symptoms comments: Mild nasal congestion..    Past Medical History  Diagnosis Date  . Menorrhagia     Past Surgical History  Procedure Date  . Tubal ligation   . Knee surgery   . Tonsillectomy   . Wisdom tooth extraction     Family History  Problem Relation Age of Onset  . Arthritis Mother   . Asthma Mother   . Hypertension Mother   . Asthma Son     History  Substance Use Topics  . Smoking status: Current Everyday Smoker -- 0.2 packs/day for 1 years    Types: Cigarettes  . Smokeless tobacco: Not on file  . Alcohol Use: 1.2 oz/week    2 Cans of beer per week    OB History    Grav Para Term Preterm Abortions TAB SAB Ect Mult Living   5 3 3  2 1 1   3       Review of Systems  Constitutional: Negative for fever, chills and fatigue.  HENT: Positive for rhinorrhea. Negative for ear pain, sore throat, sneezing, postnasal drip and sinus pressure.   Respiratory: Positive for cough. Negative for shortness of breath and wheezing.   Cardiovascular: Negative for chest pain and palpitations.    Allergies  Review of patient's allergies indicates no known allergies.  Home Medications   Current Outpatient Rx  Name Route Sig Dispense Refill  . AZITHROMYCIN 250 MG  PO TABS  2 tabs po today, then 1 daily x 4 days 6 tablet 0  . PREDNISONE 20 MG PO TABS Oral Take 1 tablet (20 mg total) by mouth 2 (two) times daily. 10 tablet 0  . PRESCRIPTION MEDICATION Both Eyes Place 1-2 drops into both eyes daily as needed. For allergies     . PROMETHAZINE-CODEINE 6.25-10 MG/5ML PO SYRP  1-2 tsp every 6 hrs prn cough 120 mL 0    BP 131/69  Pulse 63  Temp(Src) 98.1 F (36.7 C) (Oral)  Resp 12  SpO2 100%  Physical Exam  Nursing note and vitals reviewed. Constitutional: She appears well-developed and well-nourished. No distress.  HENT:  Head: Normocephalic and atraumatic.  Right Ear: Tympanic membrane, external ear and ear canal normal.  Left Ear: Tympanic membrane, external ear and ear canal normal.  Nose: Nose normal.  Mouth/Throat: Uvula is midline, oropharynx is clear and moist and mucous membranes are normal. No oropharyngeal exudate, posterior oropharyngeal edema or posterior oropharyngeal erythema.  Neck: Neck supple.  Cardiovascular: Normal rate, regular rhythm and normal heart sounds.   Pulmonary/Chest: Effort normal and breath sounds normal. No respiratory distress. She has no wheezes. She has no rales.  Lymphadenopathy:    She has no cervical adenopathy.  Neurological: She is alert.  Skin: Skin is warm and dry.  Psychiatric: She has a normal mood and affect.  ED Course  Procedures (including critical care time)  Labs Reviewed - No data to display No results found.   1. Bronchitis, acute       MDM          Melody Comas, PA 07/27/11 2029

## 2011-07-28 ENCOUNTER — Ambulatory Visit: Payer: Medicaid Other

## 2011-08-23 ENCOUNTER — Ambulatory Visit: Payer: Medicaid Other

## 2011-11-30 ENCOUNTER — Other Ambulatory Visit: Payer: Self-pay | Admitting: Nephrology

## 2011-11-30 DIAGNOSIS — M549 Dorsalgia, unspecified: Secondary | ICD-10-CM

## 2011-12-06 ENCOUNTER — Other Ambulatory Visit: Payer: Medicaid Other

## 2012-02-16 ENCOUNTER — Ambulatory Visit (HOSPITAL_COMMUNITY): Payer: Medicaid Other

## 2012-02-20 ENCOUNTER — Ambulatory Visit (HOSPITAL_COMMUNITY): Payer: Medicaid Other | Attending: Obstetrics

## 2012-06-30 ENCOUNTER — Emergency Department (HOSPITAL_COMMUNITY)
Admission: EM | Admit: 2012-06-30 | Discharge: 2012-06-30 | Disposition: A | Discharge status: Home / Self Care | Payer: Medicaid Other | Source: Home / Self Care | Attending: Family Medicine | Admitting: Family Medicine

## 2012-06-30 ENCOUNTER — Encounter (HOSPITAL_COMMUNITY): Payer: Self-pay | Admitting: Emergency Medicine

## 2012-06-30 DIAGNOSIS — IMO0002 Reserved for concepts with insufficient information to code with codable children: Secondary | ICD-10-CM

## 2012-06-30 DIAGNOSIS — L02411 Cutaneous abscess of right axilla: Secondary | ICD-10-CM

## 2012-06-30 NOTE — ED Notes (Signed)
Assisted with bedside procedure, opened abscess.  Placed dressing on wound.

## 2012-06-30 NOTE — ED Provider Notes (Signed)
History     CSN: [id number]  Arrival date & time 06/30/12  1749   First MD Initiated Contact with Patient 06/30/12 1751      Chief Complaint  Patient presents with  . Abscess    (Consider location/radiation/quality/duration/timing/severity/associated sxs/prior treatment) Patient is a 41 y.o. female presenting with abscess. The history is provided by the patient.  Abscess  This is a new problem. The current episode started more than one week ago. The onset was gradual. The problem has been gradually worsening. The abscess is present on the right arm. The problem is moderate. The abscess is characterized by redness, painfulness and swelling.    Past Medical History  Diagnosis Date  . Menorrhagia     Past Surgical History  Procedure Date  . Tubal ligation   . Knee surgery   . Tonsillectomy   . Wisdom tooth extraction     Family History  Problem Relation Age of Onset  . Arthritis Mother   . Asthma Mother   . Hypertension Mother   . Asthma Son     History  Substance Use Topics  . Smoking status: Former Smoker -- 0.2 packs/day for 1 years    Types: Cigarettes  . Smokeless tobacco: Not on file  . Alcohol Use: No    OB History    Grav Para Term Preterm Abortions TAB SAB Ect Mult Living   5 3 3  2 1 1   3       Review of Systems  Constitutional: Negative.   Skin: Positive for rash.    Allergies  Review of patient's allergies indicates no known allergies.  Home Medications   Current Outpatient Rx  Name Route Sig Dispense Refill  . AZITHROMYCIN 250 MG PO TABS  2 tabs po today, then 1 daily x 4 days 6 tablet 0  . PRESCRIPTION MEDICATION Both Eyes Place 1-2 drops into both eyes daily as needed. For allergies       BP 145/93  Pulse 80  Temp 98.3 F (36.8 C) (Oral)  Resp 19  SpO2 100%  Physical Exam  Nursing note and vitals reviewed. Constitutional: She is oriented to person, place, and time. She appears well-developed and well-nourished.    Musculoskeletal: She exhibits tenderness.  Neurological: She is alert and oriented to person, place, and time.  Skin: Skin is warm and dry.       Tender 1.5cm fluctuant abscess to right axilla.    ED Course  INCISION AND DRAINAGE Date/Time: 06/30/2012 6:49 PM Performed by: Linna Hoff Authorized by: Bradd Canary D Consent: Verbal consent obtained. Risks and benefits: risks, benefits and alternatives were discussed Consent given by: patient Type: abscess Body area: upper extremity Location details: right arm Local anesthetic: topical anesthetic Patient sedated: no Scalpel size: 11 Incision type: single straight Complexity: simple Drainage: purulent Drainage amount: moderate Wound treatment: wound left open Patient tolerance: Patient tolerated the procedure well with no immediate complications.   (including critical care time)   Labs Reviewed  CULTURE, ROUTINE-ABSCESS   No results found.   1. Abscess of axilla, right       MDM          Linna Hoff, MD 06/30/12 [phone]

## 2012-06-30 NOTE — ED Notes (Signed)
Right axilla with abscess.  Onset 2 weeks .  Patient saw dr frazier and treated with z pac.  Since then has grown in size and painful

## 2012-07-03 LAB — CULTURE, ROUTINE-ABSCESS

## 2012-07-04 ENCOUNTER — Telehealth (HOSPITAL_COMMUNITY): Payer: Self-pay | Admitting: *Deleted

## 2012-07-04 MED ORDER — SULFAMETHOXAZOLE-TRIMETHOPRIM 800-160 MG PO TABS: 1.0000 | ORAL_TABLET | Freq: Two times a day (BID) | ORAL | Status: DC

## 2012-07-04 NOTE — ED Notes (Signed)
Abscess culture R arm: Mod. Proteus Mirabilis.  Pt. had I and D, but no medicine. Chart shown to Dr. Artis Flock and he e-prescribed Septra DS. I called pt.  Pt. verified x 2 and given results.  Pt. told she needs the Septra DS and how to take it. Pt. told it was at the [facility] on [address]. Pt. voiced understanding. Vassie Moselle 07/04/2012

## 2012-07-24 ENCOUNTER — Emergency Department (INDEPENDENT_AMBULATORY_CARE_PROVIDER_SITE_OTHER)
Admission: EM | Admit: 2012-07-24 | Discharge: 2012-07-24 | Disposition: A | Discharge status: Home / Self Care | Payer: Medicaid Other | Source: Home / Self Care | Attending: Emergency Medicine | Admitting: Emergency Medicine

## 2012-07-24 ENCOUNTER — Encounter (HOSPITAL_COMMUNITY): Payer: Self-pay | Admitting: *Deleted

## 2012-07-24 DIAGNOSIS — K1120 Sialoadenitis, unspecified: Secondary | ICD-10-CM

## 2012-07-24 MED ORDER — PENICILLIN V POTASSIUM 500 MG PO TABS: 500.0000 mg | ORAL_TABLET | Freq: Three times a day (TID) | ORAL | Status: AC

## 2012-07-24 NOTE — ED Provider Notes (Signed)
History     CSN: [id number]  Arrival date & time 07/24/12  [place]   First MD Initiated Contact with Patient 07/24/12 1852      Chief Complaint  Patient presents with  . Lymphadenopathy    (Consider location/radiation/quality/duration/timing/severity/associated sxs/prior treatment) HPI Comments: Patient presents urgent care this morning presenting with an area of swelling under her right jaw line. It's tender at touch and she reports that she had a fever of 101 his abdomen. He feels ear is getting bigger and titer. Denies any trauma or injury in any discomfort swallowing process. Denies any shortness of breath, cough or runny or congested nose. She just woke up Sunday with her right ear throbbing it has gotten progressively worse since yesterday.  The history is provided by the patient.    Past Medical History  Diagnosis Date  . Menorrhagia     Past Surgical History  Procedure Date  . Tubal ligation   . Knee surgery   . Tonsillectomy   . Wisdom tooth extraction     Family History  Problem Relation Age of Onset  . Arthritis Mother   . Asthma Mother   . Hypertension Mother   . Asthma Son     History  Substance Use Topics  . Smoking status: Former Smoker -- 0.2 packs/day for 1 years    Types: Cigarettes  . Smokeless tobacco: Not on file  . Alcohol Use: 1.2 oz/week    2 Cans of beer per week     Comment: occasional    OB History    Grav Para Term Preterm Abortions TAB SAB Ect Mult Living   5 3 3  2 1 1   3       Review of Systems  Constitutional: Positive for fever. Negative for chills, activity change, appetite change and unexpected weight change.  HENT: Negative for sore throat, drooling, mouth sores, trouble swallowing, dental problem, voice change, postnasal drip and sinus pressure.   Respiratory: Negative for cough and shortness of breath.   Musculoskeletal: Negative for myalgias.  Skin: Negative for rash and wound.  Neurological: Negative for dizziness  and headaches.    Allergies  Review of patient's allergies indicates no known allergies.  Home Medications   Current Outpatient Rx  Name  Route  Sig  Dispense  Refill  . PENICILLIN V POTASSIUM 500 MG PO TABS   Oral   Take 1 tablet (500 mg total) by mouth 3 (three) times daily.   15 tablet   0   . PRESCRIPTION MEDICATION   Both Eyes   Place 1-2 drops into both eyes daily as needed. For allergies            BP 132/85  Pulse 74  Temp 98.2 F (36.8 C) (Oral)  Resp 18  SpO2 100%  Physical Exam  Nursing note and vitals reviewed. Constitutional: Vital signs are normal. She appears well-developed and well-nourished.  Non-toxic appearance. She does not have a sickly appearance. She does not appear ill. No distress.  HENT:  Head: Normocephalic.  Neck: Trachea normal. Neck supple. No JVD present. No spinous process tenderness and no muscular tenderness present. No rigidity. No edema, no erythema and normal range of motion present. No Kernig's sign noted. No mass and no thyromegaly present.    Cardiovascular: Normal rate.   Pulmonary/Chest: Effort normal and breath sounds normal.  Abdominal: Soft.  Musculoskeletal: She exhibits tenderness.  Skin: No rash noted. No erythema.    ED Course  Procedures (including  critical care time)  Labs Reviewed - No data to display No results found.   1. Sialoadenitis of submandibular gland       MDM  Right sialoadenitis. Patient is afebrile at this moment. Will start patient on antibiotics we discuss several episodes of increase salivation encourage her to return if no significant improvement for a recheck in 48 hours. If worsening despite antibiotics advised her to go to the emergency department. Patient agree with treatment plan, discharge instructions, and follow-up care as necessary        Jimmie Molly, MD 07/24/12 [phone]

## 2012-07-24 NOTE — ED Notes (Signed)
Woke up Sunday with R ear throbbing and noted the gland on R neck was swollen.  States she had a fever of 101.5 this afternoon.  Feels like its getting tighter and tighter.

## 2012-07-29 ENCOUNTER — Emergency Department (INDEPENDENT_AMBULATORY_CARE_PROVIDER_SITE_OTHER)
Admission: EM | Admit: 2012-07-29 | Discharge: 2012-07-29 | Disposition: A | Discharge status: Home / Self Care | Payer: Medicaid Other | Source: Home / Self Care

## 2012-07-29 ENCOUNTER — Encounter (HOSPITAL_COMMUNITY): Payer: Self-pay | Admitting: Emergency Medicine

## 2012-07-29 DIAGNOSIS — K115 Sialolithiasis: Secondary | ICD-10-CM

## 2012-07-29 MED ORDER — HYDROCODONE-ACETAMINOPHEN 5-325 MG PO TABS: 2.0000 | ORAL_TABLET | ORAL | Status: AC | PRN

## 2012-07-29 NOTE — ED Provider Notes (Signed)
History     CSN: [id number]  Arrival date & time 07/29/12  1222   None     Chief Complaint  Patient presents with  . Jaw Pain    (Consider location/radiation/quality/duration/timing/severity/associated sxs/prior treatment) Patient is a 41 y.o. female presenting with mouth sores. The history is provided by the patient. No language interpreter was used.  Mouth Lesions  The current episode started more than 1 week ago. The problem occurs frequently. The problem has been gradually worsening. The problem is severe. Nothing relieves the symptoms. The symptoms are aggravated by drinking and eating. Associated symptoms include mouth sores. Pertinent negatives include no neck pain and no URI.  Pt complains of swelling to the left side of her face.  Pt was seen here several days ago for the same.  No relief from antibiotics  Past Medical History  Diagnosis Date  . Menorrhagia     Past Surgical History  Procedure Date  . Tubal ligation   . Knee surgery   . Tonsillectomy   . Wisdom tooth extraction     Family History  Problem Relation Age of Onset  . Arthritis Mother   . Asthma Mother   . Hypertension Mother   . Asthma Son     History  Substance Use Topics  . Smoking status: Former Smoker -- 0.2 packs/day for 1 years    Types: Cigarettes  . Smokeless tobacco: Not on file  . Alcohol Use: 1.2 oz/week    2 Cans of beer per week     Comment: occasional    OB History    Grav Para Term Preterm Abortions TAB SAB Ect Mult Living   5 3 3  2 1 1   3       Review of Systems  HENT: Positive for mouth sores. Negative for neck pain.   All other systems reviewed and are negative.    Allergies  Review of patient's allergies indicates no known allergies.  Home Medications   Current Outpatient Rx  Name  Route  Sig  Dispense  Refill  . PENICILLIN V POTASSIUM 500 MG PO TABS   Oral   Take 1 tablet (500 mg total) by mouth 3 (three) times daily.   15 tablet   0   .  PRESCRIPTION MEDICATION   Both Eyes   Place 1-2 drops into both eyes daily as needed. For allergies            BP 122/82  Pulse 74  Temp 98.7 F (37.1 C) (Oral)  Resp 19  SpO2 100%  Physical Exam  Nursing note and vitals reviewed. Constitutional: She is oriented to person, place, and time. She appears well-developed and well-nourished.  HENT:  Head: Normocephalic and atraumatic.  Right Ear: External ear normal.  Left Ear: External ear normal.  Nose: Nose normal.  Mouth/Throat: Oropharynx is clear and moist.  Eyes: Conjunctivae normal and EOM are normal. Pupils are equal, round, and reactive to light.       Swollen right face,  Tender inner mouth  Neck: Normal range of motion. Neck supple.  Cardiovascular: Normal rate.   Pulmonary/Chest: Effort normal.  Musculoskeletal: Normal range of motion.  Neurological: She is alert and oriented to person, place, and time.    ED Course  Procedures (including critical care time)  Labs Reviewed - No data to display No results found.   No diagnosis found.    MDM  Hydrocodone continue antibiotic.   Follow up with Dr. Barbette Merino for evaluation  Lonia Skinner [place], [place] 07/29/12 1438

## 2012-07-29 NOTE — ED Notes (Signed)
Reports she was here last week for the same issue.  Reports pain in right jaw and slight going to the left jaw.  Patient states she is not able to sleep or eat.  Pain is in ear and jaw bone.

## 2012-07-29 NOTE — ED Provider Notes (Signed)
Medical screening examination/treatment/procedure(s) were performed by non-physician practitioner and as supervising physician I was immediately available for consultation/collaboration.  Leslee Home, M.D.   Reuben Likes, MD 07/29/12 2139

## 2012-09-27 ENCOUNTER — Other Ambulatory Visit: Payer: Self-pay | Admitting: Nephrology

## 2012-09-27 DIAGNOSIS — Z1231 Encounter for screening mammogram for malignant neoplasm of breast: Secondary | ICD-10-CM

## 2012-10-30 ENCOUNTER — Ambulatory Visit
Admission: RE | Admit: 2012-10-30 | Discharge: 2012-10-30 | Disposition: A | Discharge status: Home / Self Care | Payer: Medicaid Other | Source: Ambulatory Visit | Attending: Nephrology | Admitting: Nephrology

## 2012-10-30 DIAGNOSIS — Z1231 Encounter for screening mammogram for malignant neoplasm of breast: Secondary | ICD-10-CM

## 2012-12-07 ENCOUNTER — Ambulatory Visit (INDEPENDENT_AMBULATORY_CARE_PROVIDER_SITE_OTHER): Payer: Medicaid Other | Admitting: General Surgery

## 2012-12-17 ENCOUNTER — Ambulatory Visit (INDEPENDENT_AMBULATORY_CARE_PROVIDER_SITE_OTHER): Payer: Medicaid Other | Admitting: General Surgery

## 2012-12-18 ENCOUNTER — Encounter (INDEPENDENT_AMBULATORY_CARE_PROVIDER_SITE_OTHER): Payer: Self-pay | Admitting: General Surgery

## 2013-01-04 ENCOUNTER — Encounter: Payer: Self-pay | Admitting: Obstetrics

## 2013-01-04 ENCOUNTER — Ambulatory Visit (INDEPENDENT_AMBULATORY_CARE_PROVIDER_SITE_OTHER): Payer: Medicaid Other | Admitting: Obstetrics

## 2013-01-04 VITALS — BP 126/91 | HR 90 | Temp 97.1°F | Wt 199.0 lb

## 2013-01-04 DIAGNOSIS — Z113 Encounter for screening for infections with a predominantly sexual mode of transmission: Secondary | ICD-10-CM

## 2013-01-04 DIAGNOSIS — R109 Unspecified abdominal pain: Secondary | ICD-10-CM

## 2013-01-04 DIAGNOSIS — R102 Pelvic and perineal pain unspecified side: Secondary | ICD-10-CM | POA: Insufficient documentation

## 2013-01-04 DIAGNOSIS — N949 Unspecified condition associated with female genital organs and menstrual cycle: Secondary | ICD-10-CM

## 2013-01-04 MED ORDER — HYDROCODONE-ACETAMINOPHEN 7.5-300 MG PO TABS: ORAL_TABLET | ORAL | Status: DC

## 2013-01-04 NOTE — Progress Notes (Signed)
Subjective:    Lisa Farley is a 42 y.o. female who presents for evaluation of menstrual symptoms. Symptoms began 1 week ago. Patient describes symptoms of dyspareunia (mild) and menstrual cramping (severe). Symptoms occur for the past week. Patient denies anxiety, bloating/fluid retention, breast tenderness, decreased libido, depression, insomnia, labile mood, menorrhagia and pelvic pain. Evaluation to date includes: none. Treatment to date includes: Mirena IUD. The patient is not currently sexually active.   Menstrual History: OB History   Grav Para Term Preterm Abortions TAB SAB Ect Mult Living   5 3 3  2 1 1   3       Menarche age: 37  No LMP recorded. Patient is not currently having periods (Reason: IUD).    The following portions of the patient's history were reviewed and updated as appropriate: allergies, current medications, past family history, past medical history, past social history, past surgical history and problem list.  Review of Systems Pertinent items are noted in HPI.   Objective:    BP 126/91  Pulse 90  Temp(Src) 97.1 F (36.2 C) (Oral)  Wt 199 lb (90.266 kg)  BMI 33.12 kg/m2 Abdomen: abnormal findings:  mild tenderness in the LLQ Pelvic: cervix normal in appearance, external genitalia normal, no cervical motion tenderness, uterus normal size, shape, and consistency and vagina normal without discharge   Left adnexal tenderness.  No masses.  Assessment:    Pelvic pain.  R/O left ovarian cyst.    Plan:    Pelvic ultrasound.  Vicodin ES Rx.

## 2013-01-04 NOTE — Patient Instructions (Signed)
Pelvic pain

## 2013-01-11 ENCOUNTER — Ambulatory Visit (HOSPITAL_COMMUNITY): Payer: Medicaid Other | Attending: Obstetrics

## 2013-01-22 ENCOUNTER — Ambulatory Visit: Payer: Medicaid Other | Admitting: Obstetrics

## 2013-02-07 ENCOUNTER — Other Ambulatory Visit: Payer: Self-pay | Admitting: Obstetrics

## 2013-02-07 ENCOUNTER — Ambulatory Visit (HOSPITAL_COMMUNITY): Admission: RE | Admit: 2013-02-07 | Payer: Medicaid Other | Source: Ambulatory Visit

## 2013-02-07 DIAGNOSIS — N949 Unspecified condition associated with female genital organs and menstrual cycle: Secondary | ICD-10-CM

## 2013-02-11 ENCOUNTER — Institutional Professional Consult (permissible substitution): Payer: Medicaid Other | Admitting: Internal Medicine

## 2013-02-22 ENCOUNTER — Encounter (HOSPITAL_COMMUNITY): Payer: Self-pay

## 2013-02-22 ENCOUNTER — Emergency Department (HOSPITAL_COMMUNITY): Payer: Medicaid Other

## 2013-02-22 ENCOUNTER — Emergency Department (HOSPITAL_COMMUNITY)
Admission: EM | Admit: 2013-02-22 | Discharge: 2013-02-22 | Disposition: A | Discharge status: Home / Self Care | Payer: Medicaid Other | Attending: Emergency Medicine | Admitting: Emergency Medicine

## 2013-02-22 DIAGNOSIS — Z87891 Personal history of nicotine dependence: Secondary | ICD-10-CM | POA: Insufficient documentation

## 2013-02-22 DIAGNOSIS — Z8742 Personal history of other diseases of the female genital tract: Secondary | ICD-10-CM | POA: Insufficient documentation

## 2013-02-22 DIAGNOSIS — S8391XA Sprain of unspecified site of right knee, initial encounter: Secondary | ICD-10-CM

## 2013-02-22 DIAGNOSIS — Z975 Presence of (intrauterine) contraceptive device: Secondary | ICD-10-CM | POA: Insufficient documentation

## 2013-02-22 DIAGNOSIS — Z23 Encounter for immunization: Secondary | ICD-10-CM | POA: Insufficient documentation

## 2013-02-22 DIAGNOSIS — T148XXA Other injury of unspecified body region, initial encounter: Secondary | ICD-10-CM

## 2013-02-22 DIAGNOSIS — Z791 Long term (current) use of non-steroidal anti-inflammatories (NSAID): Secondary | ICD-10-CM | POA: Insufficient documentation

## 2013-02-22 DIAGNOSIS — Y9389 Activity, other specified: Secondary | ICD-10-CM | POA: Insufficient documentation

## 2013-02-22 DIAGNOSIS — Y9289 Other specified places as the place of occurrence of the external cause: Secondary | ICD-10-CM | POA: Insufficient documentation

## 2013-02-22 DIAGNOSIS — W172XXA Fall into hole, initial encounter: Secondary | ICD-10-CM | POA: Insufficient documentation

## 2013-02-22 DIAGNOSIS — IMO0002 Reserved for concepts with insufficient information to code with codable children: Secondary | ICD-10-CM | POA: Insufficient documentation

## 2013-02-22 DIAGNOSIS — Z79899 Other long term (current) drug therapy: Secondary | ICD-10-CM | POA: Insufficient documentation

## 2013-02-22 MED ORDER — HYDROCODONE-ACETAMINOPHEN 5-325 MG PO TABS
2.0000 | ORAL_TABLET | Freq: Once | ORAL | Status: AC
  Administered 2013-02-22: 2 via ORAL
  Filled 2013-02-22: qty 2

## 2013-02-22 MED ORDER — OXYCODONE-ACETAMINOPHEN 5-325 MG PO TABS: ORAL_TABLET | ORAL | Status: DC

## 2013-02-22 MED ORDER — TETANUS-DIPHTH-ACELL PERTUSSIS 5-2.5-18.5 LF-MCG/0.5 IM SUSP
0.5000 mL | Freq: Once | INTRAMUSCULAR | Status: AC
  Administered 2013-02-22: 0.5 mL via INTRAMUSCULAR
  Filled 2013-02-22: qty 0.5
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
## 2013-02-22 NOTE — ED Provider Notes (Signed)
Medical screening examination/treatment/procedure(s) were performed by non-physician practitioner and as supervising physician I was immediately available for consultation/collaboration.  Sunnie Nielsen, MD 02/22/13 2132

## 2013-08-30 ENCOUNTER — Other Ambulatory Visit: Payer: Self-pay | Admitting: *Deleted

## 2013-08-30 DIAGNOSIS — B379 Candidiasis, unspecified: Secondary | ICD-10-CM

## 2013-08-30 MED ORDER — FLUCONAZOLE 150 MG PO TABS: 150.0000 mg | ORAL_TABLET | Freq: Once | ORAL | Status: DC

## 2013-11-11 ENCOUNTER — Other Ambulatory Visit: Payer: Self-pay

## 2013-11-11 DIAGNOSIS — Z1231 Encounter for screening mammogram for malignant neoplasm of breast: Secondary | ICD-10-CM

## 2013-11-27 ENCOUNTER — Ambulatory Visit: Payer: Medicaid Other

## 2014-01-19 ENCOUNTER — Emergency Department (HOSPITAL_COMMUNITY): Payer: Medicaid Other

## 2014-01-19 ENCOUNTER — Encounter (HOSPITAL_COMMUNITY): Payer: Self-pay | Admitting: Emergency Medicine

## 2014-01-19 ENCOUNTER — Emergency Department (HOSPITAL_COMMUNITY)
Admission: EM | Admit: 2014-01-19 | Discharge: 2014-01-19 | Disposition: A | Discharge status: Home / Self Care | Payer: Medicaid Other | Attending: Emergency Medicine | Admitting: Emergency Medicine

## 2014-01-19 DIAGNOSIS — Z87891 Personal history of nicotine dependence: Secondary | ICD-10-CM | POA: Insufficient documentation

## 2014-01-19 DIAGNOSIS — S0083XA Contusion of other part of head, initial encounter: Secondary | ICD-10-CM | POA: Insufficient documentation

## 2014-01-19 DIAGNOSIS — Z791 Long term (current) use of non-steroidal anti-inflammatories (NSAID): Secondary | ICD-10-CM | POA: Insufficient documentation

## 2014-01-19 DIAGNOSIS — S0230XA Fracture of orbital floor, unspecified side, initial encounter for closed fracture: Secondary | ICD-10-CM | POA: Insufficient documentation

## 2014-01-19 DIAGNOSIS — H1130 Conjunctival hemorrhage, unspecified eye: Secondary | ICD-10-CM | POA: Insufficient documentation

## 2014-01-19 DIAGNOSIS — S0003XA Contusion of scalp, initial encounter: Secondary | ICD-10-CM | POA: Insufficient documentation

## 2014-01-19 DIAGNOSIS — Z8742 Personal history of other diseases of the female genital tract: Secondary | ICD-10-CM | POA: Insufficient documentation

## 2014-01-19 DIAGNOSIS — S1093XA Contusion of unspecified part of neck, initial encounter: Secondary | ICD-10-CM

## 2014-01-19 DIAGNOSIS — Z79899 Other long term (current) drug therapy: Secondary | ICD-10-CM | POA: Insufficient documentation

## 2014-01-19 MED ORDER — OXYCODONE-ACETAMINOPHEN 5-325 MG PO TABS: 2.0000 | ORAL_TABLET | Freq: Four times a day (QID) | ORAL | Status: AC | PRN

## 2014-01-19 MED ORDER — OXYCODONE-ACETAMINOPHEN 5-325 MG PO TABS
2.0000 | ORAL_TABLET | Freq: Once | ORAL | Status: AC
  Administered 2014-01-19: 2 via ORAL
  Filled 2014-01-19: qty 2
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
## 2014-01-19 NOTE — Discharge Instructions (Signed)
Contusion °A contusion is a deep bruise. Contusions happen when an injury causes bleeding under the skin. Signs of bruising include pain, puffiness (swelling), and discolored skin. The contusion may turn blue, purple, or yellow. °HOME CARE  °· Put ice on the injured area. °· Put ice in a plastic bag. °· Place a towel between your skin and the bag. °· Leave the ice on for 15-20 minutes, 03-04 times a day. °· Only take medicine as told by your doctor. °· Rest the injured area. °· If possible, raise (elevate) the injured area to lessen puffiness. °GET HELP RIGHT AWAY IF:  °· You have more bruising or puffiness. °· You have pain that is getting worse. °· Your puffiness or pain is not helped by medicine. °MAKE SURE YOU:  °· Understand these instructions. °· Will watch your condition. °· Will get help right away if you are not doing well or get worse. °Document Released: 02/15/2008 Document Revised: 11/21/2011 Document Reviewed: 07/04/2011 °Exit[employer]® Patient Information ©2014 [employer]. ° °Cryotherapy °Cryotherapy means treatment with cold. Ice or gel packs can be used to reduce both pain and swelling. Ice is the most helpful within the first 24 to 48 hours after an injury or flareup from overusing a muscle or joint. Sprains, strains, spasms, burning pain, shooting pain, and aches can all be eased with ice. Ice can also be used when recovering from surgery. Ice is effective, has very few side effects, and is safe for most people to use. °PRECAUTIONS  °Ice is not a safe treatment option for people with: °· Raynaud's phenomenon. This is a condition affecting small blood vessels in the extremities. Exposure to cold may cause your problems to return. °· Cold hypersensitivity. There are many forms of cold hypersensitivity, including: °· Cold urticaria. Red, itchy hives appear on the skin when the tissues begin to warm after being iced. °· Cold erythema. This is a red, itchy rash caused by exposure to cold. °· Cold  hemoglobinuria. Red blood cells break down when the tissues begin to warm after being iced. The hemoglobin that carry oxygen are passed into the urine because they cannot combine with blood proteins fast enough. °· Numbness or altered sensitivity in the area being iced. °If you have any of the following conditions, do not use ice until you have discussed cryotherapy with your caregiver: °· Heart conditions, such as arrhythmia, angina, or chronic heart disease. °· High blood pressure. °· Healing wounds or open skin in the area being iced. °· Current infections. °· Rheumatoid arthritis. °· Poor circulation. °· Diabetes. °Ice slows the blood flow in the region it is applied. This is beneficial when trying to stop inflamed tissues from spreading irritating chemicals to surrounding tissues. However, if you expose your skin to cold temperatures for too long or without the proper protection, you can damage your skin or nerves. Watch for signs of skin damage due to cold. °HOME CARE INSTRUCTIONS °Follow these tips to use ice and cold packs safely. °· Place a dry or damp towel between the ice and skin. A damp towel will cool the skin more quickly, so you may need to shorten the time that the ice is used. °· For a more rapid response, add gentle compression to the ice. °· Ice for no more than 10 to 20 minutes at a time. The bonier the area you are icing, the less time it will take to get the benefits of ice. °· Check your skin after 5 minutes to make sure   there are no signs of a poor response to cold or skin damage.  Rest 20 minutes or more in between uses.  Once your skin is numb, you can end your treatment. You can test numbness by very lightly touching your skin. The touch should be so light that you do not see the skin dimple from the pressure of your fingertip. When using ice, most people will feel these normal sensations in this order: cold, burning, aching, and numbness.  Do not use ice on someone who cannot  communicate their responses to pain, such as small children or people with dementia. HOW TO MAKE AN ICE PACK Ice packs are the most common way to use ice therapy. Other methods include ice massage, ice baths, and cryo-sprays. Muscle creams that cause a cold, tingly feeling do not offer the same benefits that ice offers and should not be used as a substitute unless recommended by your caregiver. To make an ice pack, do one of the following:  Place crushed ice or a bag of frozen vegetables in a sealable plastic bag. Squeeze out the excess air. Place this bag inside another plastic bag. Slide the bag into a pillowcase or place a damp towel between your skin and the bag.  Mix 3 parts water with 1 part rubbing alcohol. Freeze the mixture in a sealable plastic bag. When you remove the mixture from the freezer, it will be slushy. Squeeze out the excess air. Place this bag inside another plastic bag. Slide the bag into a pillowcase or place a damp towel between your skin and the bag. SEEK MEDICAL CARE IF:  You develop white spots on your skin. This may give the skin a blotchy (mottled) appearance.  Your skin turns blue or pale.  Your skin becomes waxy or hard.  Your swelling gets worse. MAKE SURE YOU:   Understand these instructions.  Will watch your condition.  Will get help right away if you are not doing well or get worse. Document Released: 04/25/2011 Document Revised: 11/21/2011 Document Reviewed: 04/25/2011 [facility] Patient Information 2014 [place]Care, [place]. [facility] and Dr. Charlotte SanesMcCuen for appointment next week
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
## 2014-03-17 ENCOUNTER — Telehealth: Payer: Self-pay | Admitting: *Deleted

## 2014-03-17 DIAGNOSIS — B9689 Other specified bacterial agents as the cause of diseases classified elsewhere: Secondary | ICD-10-CM

## 2014-03-17 DIAGNOSIS — N760 Acute vaginitis: Principal | ICD-10-CM

## 2014-03-17 MED ORDER — METRONIDAZOLE 500 MG PO TABS: 500.0000 mg | ORAL_TABLET | Freq: Two times a day (BID) | ORAL | Status: DC

## 2014-03-17 NOTE — Telephone Encounter (Signed)
Patient called to request a refill. Patient states her partner has been with other people and she is experiencing discharge with odor. Patient is requesting treatment for BV- encouraged patient to make an appointment for follow up STD testing. Appointment 03/27/2014. Flagyl sent to pharmacy.

## 2014-03-27 ENCOUNTER — Ambulatory Visit: Payer: Medicaid Other | Admitting: Obstetrics

## 2014-07-14 ENCOUNTER — Encounter (HOSPITAL_COMMUNITY): Payer: Self-pay | Admitting: Emergency Medicine

## 2014-09-25 ENCOUNTER — Telehealth: Payer: Self-pay | Admitting: Obstetrics

## 2014-09-25 ENCOUNTER — Telehealth: Payer: Self-pay | Admitting: *Deleted

## 2014-09-25 DIAGNOSIS — N760 Acute vaginitis: Principal | ICD-10-CM

## 2014-09-25 DIAGNOSIS — B9689 Other specified bacterial agents as the cause of diseases classified elsewhere: Secondary | ICD-10-CM

## 2014-09-25 MED ORDER — METRONIDAZOLE 500 MG PO TABS: 500.0000 mg | ORAL_TABLET | Freq: Two times a day (BID) | ORAL | Status: AC

## 2014-09-25 NOTE — Telephone Encounter (Signed)
Patient states she is having a fishy vaginal odor. Patient states she is not having any discharge or itching. Patient states she notices this after she has intercourse. Per nursing protocol prescription sent to the pharmacy for Flagyl. Patient advised she is in need of an annual exam and that our scheduler would give her a call to set that up.

## 2014-09-30 NOTE — Telephone Encounter (Signed)
[id number] - No response from patient. brm

## 2016-09-15 ENCOUNTER — Emergency Department (HOSPITAL_COMMUNITY): Payer: Medicaid Other

## 2016-09-15 ENCOUNTER — Encounter (HOSPITAL_COMMUNITY): Payer: Self-pay | Admitting: *Deleted

## 2016-09-15 ENCOUNTER — Emergency Department (HOSPITAL_COMMUNITY)
Admission: EM | Admit: 2016-09-15 | Discharge: 2016-09-16 | Disposition: A | Discharge status: Home / Self Care | Payer: Medicaid Other | Attending: Emergency Medicine | Admitting: Emergency Medicine

## 2016-09-15 DIAGNOSIS — R197 Diarrhea, unspecified: Secondary | ICD-10-CM | POA: Insufficient documentation

## 2016-09-15 DIAGNOSIS — R1084 Generalized abdominal pain: Secondary | ICD-10-CM | POA: Insufficient documentation

## 2016-09-15 DIAGNOSIS — Z87891 Personal history of nicotine dependence: Secondary | ICD-10-CM | POA: Insufficient documentation

## 2016-09-15 DIAGNOSIS — R1032 Left lower quadrant pain: Secondary | ICD-10-CM | POA: Diagnosis present

## 2016-09-15 LAB — COMPREHENSIVE METABOLIC PANEL
ALT: 17 U/L (ref 14–54)
ANION GAP: 7 (ref 5–15)
AST: 22 U/L (ref 15–41)
Albumin: 4.1 g/dL (ref 3.5–5.0)
Alkaline Phosphatase: 62 U/L (ref 38–126)
BUN: 10 mg/dL (ref 6–20)
CALCIUM: 9 mg/dL (ref 8.9–10.3)
CO2: 26 mmol/L (ref 22–32)
Chloride: 106 mmol/L (ref 101–111)
Creatinine, Ser: 0.73 mg/dL (ref 0.44–1.00)
Glucose, Bld: 89 mg/dL (ref 65–99)
Potassium: 3.5 mmol/L (ref 3.5–5.1)
SODIUM: 139 mmol/L (ref 135–145)
TOTAL PROTEIN: 7.5 g/dL (ref 6.5–8.1)
Total Bilirubin: 0.2 mg/dL — ABNORMAL LOW (ref 0.3–1.2)

## 2016-09-15 LAB — URINALYSIS, ROUTINE W REFLEX MICROSCOPIC
BILIRUBIN URINE: NEGATIVE
GLUCOSE, UA: NEGATIVE mg/dL
Ketones, ur: NEGATIVE mg/dL
LEUKOCYTES UA: NEGATIVE
NITRITE: NEGATIVE
PROTEIN: NEGATIVE mg/dL
Specific Gravity, Urine: 1.005 (ref 1.005–1.030)
pH: 6 (ref 5.0–8.0)

## 2016-09-15 LAB — CBC
HCT: 34.2 % — ABNORMAL LOW (ref 36.0–46.0)
HEMOGLOBIN: 11 g/dL — AB (ref 12.0–15.0)
MCH: 29.7 pg (ref 26.0–34.0)
MCHC: 32.2 g/dL (ref 30.0–36.0)
MCV: 92.4 fL (ref 78.0–100.0)
Platelets: 288 10*3/uL (ref 150–400)
RBC: 3.7 MIL/uL — ABNORMAL LOW (ref 3.87–5.11)
RDW: 13.4 % (ref 11.5–15.5)
WBC: 6.8 10*3/uL (ref 4.0–10.5)

## 2016-09-15 LAB — POC URINE PREG, ED: PREG TEST UR: NEGATIVE

## 2016-09-15 LAB — LIPASE, BLOOD: Lipase: 25 U/L (ref 11–51)

## 2016-09-15 MED ORDER — IOPAMIDOL (ISOVUE-300) INJECTION 61%
100.0000 mL | Freq: Once | INTRAVENOUS | Status: AC | PRN
  Administered 2016-09-15: 100 mL via INTRAVENOUS

## 2016-09-15 MED ORDER — IOPAMIDOL (ISOVUE-300) INJECTION 61%
INTRAVENOUS | Status: AC
  Administered 2016-09-16
  Filled 2016-09-15: qty 100

## 2016-09-15 MED ORDER — SODIUM CHLORIDE 0.9 % IV SOLN
Freq: Once | INTRAVENOUS | Status: AC
  Administered 2016-09-15: 23:00:00 via INTRAVENOUS

## 2016-09-15 NOTE — ED Triage Notes (Signed)
Pt complains of n/v/d and abdominal pain for 1 month, worse over the past 3 days. Pt states she vomited and had diarrhea last night. Pt states pain is worse on his right side.

## 2016-09-15 NOTE — ED Provider Notes (Signed)
[facility]-EMERGENCY DEPT Provider Note   CSN: [id number] Arrival date & time: 09/15/16  1741  By signing my name below, I, Orpah Cobb, attest that this documentation has been prepared under the direction and in the presence of Earley Favor, NP-C. Electronically Signed: Orpah Cobb , ED Scribe. 09/16/16. 12:44 AM.   History   Chief Complaint Chief Complaint  Patient presents with  . Abdominal Pain    HPI  HPI Comments: Lisa Farley is a 46 y.o. female who presents to the Emergency Department complaining of mild to moderate abdominal pain with sudden onset x1 month. Pt states that she has had worsening abdominal pain for the past month but over the past x3 days she has had worsening symptoms NVD. Pt reports LLQ, RLQ and RUQ abdominal pain and describes it as a "tightness." She reports associated nausea, vomiting, diarrhea, intermittent lightheadedness and appetite change. Pt has taken Ranitidine 300mg  and Finnegan for the nausea with mild relief. She states that walking exacerbates the pain. Pt denies dysuria, vaginal discharge/discomfort. She denies any surgical hx to abdomen, drinking/eating exacerbating the pain.  The history is provided by the patient. No language interpreter was used.    Past Medical History:  Diagnosis Date  . Menorrhagia     Patient Active Problem List   Diagnosis Date Noted  . Pelvic pain in female 01/04/2013    Past Surgical History:  Procedure Laterality Date  . KNEE SURGERY    . TONSILLECTOMY    . TUBAL LIGATION    . WISDOM TOOTH EXTRACTION      OB History    Gravida Para Term Preterm AB Living   5 3 3   2 3    SAB TAB Ectopic Multiple Live Births   1 1             Home Medications    Prior to Admission medications   Medication Sig Start Date End Date Taking? Authorizing Provider  cloNIDine (CATAPRES) 0.2 MG tablet Take 1 tablet by mouth every evening 08/18/16  Yes Historical Provider, MD  gabapentin (NEURONTIN) 100 MG capsule  Take 100 mg by mouth 3 (three) times daily as needed (leg pain).  06/15/16  Yes Historical Provider, MD  lisinopril-hydrochlorothiazide (PRINZIDE,ZESTORETIC) 20-25 MG per tablet Take 1 tablet by mouth daily.   Yes Historical Provider, MD  oxyCODONE-acetaminophen (PERCOCET) 10-325 MG tablet Take 1 tablet by mouth four times a day as needed for pain 08/19/16  Yes Historical Provider, MD  ranitidine (ZANTAC) 300 MG tablet Take 300 mg by mouth daily. 08/17/16  Yes Historical Provider, MD  VOLTAREN 1 % GEL APPLY a SMALL AMOUNT TO affected AREA TWICE DAILY 07/22/16  Yes Historical Provider, MD  dicyclomine (BENTYL) 20 MG tablet Take 1 tablet (20 mg total) by mouth 2 (two) times daily. 09/16/16   Earley Favor, NP  levonorgestrel (MIRENA) 20 MCG/24HR IUD 1 each by Intrauterine route once. Inserted 02/13/2012    Brock Bad, MD  metroNIDAZOLE (FLAGYL) 500 MG tablet Take 1 tablet (500 mg total) by mouth 2 (two) times daily. Patient not taking: Reported on 09/15/2016 09/25/14   Brock Bad, MD  oxyCODONE-acetaminophen (PERCOCET/ROXICET) 5-325 MG per tablet Take 2 tablets by mouth every 6 (six) hours as needed for severe pain. Patient not taking: Reported on 09/15/2016 01/19/14   Earley Favor, NP  tetrahydrozoline 0.05 % ophthalmic solution Place 1 drop into both eyes as needed (allergies).    Historical Provider, MD    Family History Family History  Problem Relation Age of Onset  . Arthritis Mother   . Asthma Mother   . Hypertension Mother   . Asthma Son     Social History Social History  Substance Use Topics  . Smoking status: Former Smoker    Packs/day: 0.25    Years: 1.00    Types: Cigarettes  . Smokeless tobacco: Never Used  . Alcohol use 1.2 oz/week    2 Cans of beer per week     Comment: occasional     Allergies   Patient has no known allergies.   Review of Systems Review of Systems  Constitutional: Positive for appetite change. Negative for chills and fever.  HENT: Negative for ear  pain and sore throat.   Eyes: Negative for pain and visual disturbance.  Respiratory: Negative for cough and shortness of breath.   Cardiovascular: Negative for chest pain and palpitations.  Gastrointestinal: Positive for diarrhea, nausea and vomiting. Negative for abdominal pain.  Genitourinary: Negative for dysuria, hematuria, vaginal bleeding, vaginal discharge and vaginal pain.  Musculoskeletal: Negative for arthralgias and back pain.  Skin: Negative for color change and rash.  Neurological: Positive for light-headedness. Negative for seizures and syncope.  All other systems reviewed and are negative.    Physical Exam Updated Vital Signs BP 148/89 (BP Location: Right Arm)   Pulse 94   Temp 98.5 F (36.9 C) (Oral)   Resp 18   SpO2 97%   Physical Exam  Constitutional: She appears well-developed and well-nourished. No distress.  HENT:  Head: Normocephalic and atraumatic.  Eyes: Conjunctivae are normal.  Neck: Neck supple.  Cardiovascular: Normal rate and regular rhythm.   No murmur heard. Pulmonary/Chest: Effort normal and breath sounds normal. No respiratory distress.  Abdominal: Soft. There is no tenderness.  Musculoskeletal: She exhibits no edema.  Neurological: She is alert.  Skin: Skin is warm and dry.  Psychiatric: She has a normal mood and affect.  Nursing note and vitals reviewed.    ED Treatments / Results   DIAGNOSTIC STUDIES: Oxygen Saturation is 97% on RA, adequate by my interpretation.   COORDINATION OF CARE: 12:44 AM-Discussed next steps with pt. Pt verbalized understanding and is agreeable with the plan.    Labs (all labs ordered are listed, but only abnormal results are displayed) Labs Reviewed  COMPREHENSIVE METABOLIC PANEL - Abnormal; Notable for the following:       Result Value   Total Bilirubin 0.2 (*)    All other components within normal limits  CBC - Abnormal; Notable for the following:    RBC 3.70 (*)    Hemoglobin 11.0 (*)    HCT  34.2 (*)    All other components within normal limits  URINALYSIS, ROUTINE W REFLEX MICROSCOPIC - Abnormal; Notable for the following:    Color, Urine STRAW (*)    Hgb urine dipstick SMALL (*)    Bacteria, UA RARE (*)    Squamous Epithelial / LPF 0-5 (*)    All other components within normal limits  LIPASE, BLOOD  POC URINE PREG, ED    EKG  EKG Interpretation None       Radiology Ct Abdomen Pelvis W Contrast  Result Date: 09/15/2016 CLINICAL DATA:  Nausea, vomiting, diarrhea and abdominal pain for 1 month worsening over the past 3 days EXAM: CT ABDOMEN AND PELVIS WITH CONTRAST TECHNIQUE: Multidetector CT imaging of the abdomen and pelvis was performed using the standard protocol following bolus administration of intravenous contrast. CONTRAST:  ISOVUE-300 IOPAMIDOL (ISOVUE-300) INJECTION  61% COMPARISON:  CT from 03/16/2009 FINDINGS: Lower chest: No acute abnormality. Hepatobiliary: No focal liver abnormality is seen. No gallstones, gallbladder wall thickening, or biliary dilatation. Pancreas: Unremarkable. No pancreatic ductal dilatation or surrounding inflammatory changes. Spleen: Normal in size without focal abnormality. Adrenals/Urinary Tract: Adrenal glands are unremarkable. Kidneys are normal, without renal calculi, focal lesion, or hydronephrosis. Bladder is unremarkable. Stomach/Bowel: Stomach is relatively decompressed. Normal small bowel rotation without obstruction or inflammation. Moderate stool burden along the in right and transverse colon. Normal appearing appendix. Vascular/Lymphatic: No significant vascular findings are present. No enlarged abdominal or pelvic lymph nodes. Retroaortic left renal vein. Reproductive: IUD in the uterus.  No adnexal mass. Other: Small fat containing umbilical hernia. No abdominopelvic ascites. Musculoskeletal: L5-S1 degenerative disc disease with facet hypertrophy and sclerosis from L4 through S1. Left L5-S1 neural foraminal encroachment from  spurring off the facets and endplate. No acute osseous abnormality. Subcortical cysts of both acetabular roofs. IMPRESSION: No acute intra-abdominal or pelvic abnormality. Small umbilical fat containing hernia. Electronically Signed   By: Tollie Ethavid  Kwon M.D.   On: 09/15/2016 23:34    Procedures Procedures (including critical care time)  Medications Ordered in ED Medications  0.9 %  sodium chloride infusion ( Intravenous New Bag/Given 09/15/16 2311)  iopamidol (ISOVUE-300) 61 % injection 100 mL (100 mLs Intravenous Contrast Given 09/15/16 2316)  iopamidol (ISOVUE-300) 61 % injection (  Contrast Given 09/16/16 0008)     Initial Impression / Assessment and Plan / ED Course  I have reviewed the triage vital signs and the nursing notes.  Pertinent labs & imaging results that were available during my care of the patient were reviewed by me and considered in my medical decision making (see chart for details).  Clinical Course    CT scan shows normal abdominal/pelvic organs.  Patient will be discharged home with a prescription for Bentyl for abdominal cramping and a GI referral    Final Clinical Impressions(s) / ED Diagnoses   Final diagnoses:  Generalized abdominal pain  Diarrhea, unspecified type    New Prescriptions New Prescriptions   DICYCLOMINE (BENTYL) 20 MG TABLET    Take 1 tablet (20 mg total) by mouth 2 (two) times daily.   I personally performed the services described in this documentation, which was scribed in my presence. The recorded information has been reviewed and is accurate.    Earley FavorGail Marcele Kosta, NP 09/16/16 0041    Earley FavorGail Jonte Wollam, NP 09/16/16 [id number]    Doug SouSam Jacubowitz, MD 09/16/16 0120

## 2016-09-16 MED ORDER — DICYCLOMINE HCL 20 MG PO TABS: 20.0000 mg | ORAL_TABLET | Freq: Two times a day (BID) | ORAL | 0 refills | Status: DC

## 2016-09-16 NOTE — Discharge Instructions (Signed)
Today your examination was benign.  Your lab work is  within normal parameters.  CT scan shows normal internal organs.  You have been given a prescription for medication called Bentyl, which is for abdominal cramping as well as dietary guidelines to help with food choices to help relieve your loose, diarrheal stools.  He been given a referral to gastroenterology for further evaluation due to the chronic nature of your abdominal pain

## 2019-02-19 ENCOUNTER — Other Ambulatory Visit: Payer: Self-pay

## 2019-02-19 ENCOUNTER — Emergency Department (HOSPITAL_COMMUNITY): Payer: Medicaid Other

## 2019-02-19 ENCOUNTER — Emergency Department (HOSPITAL_COMMUNITY)
Admission: EM | Admit: 2019-02-19 | Discharge: 2019-02-19 | Disposition: A | Discharge status: Home / Self Care | Payer: Medicaid Other | Attending: Emergency Medicine | Admitting: Emergency Medicine

## 2019-02-19 ENCOUNTER — Encounter (HOSPITAL_COMMUNITY): Payer: Self-pay

## 2019-02-19 DIAGNOSIS — R112 Nausea with vomiting, unspecified: Secondary | ICD-10-CM | POA: Diagnosis not present

## 2019-02-19 DIAGNOSIS — R1031 Right lower quadrant pain: Secondary | ICD-10-CM | POA: Insufficient documentation

## 2019-02-19 DIAGNOSIS — R6883 Chills (without fever): Secondary | ICD-10-CM | POA: Diagnosis not present

## 2019-02-19 DIAGNOSIS — Z79899 Other long term (current) drug therapy: Secondary | ICD-10-CM | POA: Diagnosis not present

## 2019-02-19 DIAGNOSIS — R1011 Right upper quadrant pain: Secondary | ICD-10-CM | POA: Diagnosis present

## 2019-02-19 DIAGNOSIS — Z87891 Personal history of nicotine dependence: Secondary | ICD-10-CM | POA: Diagnosis not present

## 2019-02-19 DIAGNOSIS — R1084 Generalized abdominal pain: Secondary | ICD-10-CM

## 2019-02-19 DIAGNOSIS — E860 Dehydration: Secondary | ICD-10-CM | POA: Insufficient documentation

## 2019-02-19 DIAGNOSIS — R1032 Left lower quadrant pain: Secondary | ICD-10-CM | POA: Diagnosis not present

## 2019-02-19 LAB — COMPREHENSIVE METABOLIC PANEL
ALT: 17 U/L (ref 0–44)
AST: 19 U/L (ref 15–41)
Albumin: 5.1 g/dL — ABNORMAL HIGH (ref 3.5–5.0)
Alkaline Phosphatase: 65 U/L (ref 38–126)
Anion gap: 10 (ref 5–15)
BUN: 12 mg/dL (ref 6–20)
CO2: 26 mmol/L (ref 22–32)
Calcium: 9.7 mg/dL (ref 8.9–10.3)
Chloride: 102 mmol/L (ref 98–111)
Creatinine, Ser: 0.8 mg/dL (ref 0.44–1.00)
GFR calc Af Amer: >60 mL/min (ref >60)
GFR calc non Af Amer: >60 mL/min (ref >60)
Glucose, Bld: 99 mg/dL (ref 70–99)
Potassium: 3.1 mmol/L — ABNORMAL LOW (ref 3.5–5.1)
Sodium: 138 mmol/L (ref 135–145)
Total Bilirubin: 0.9 mg/dL (ref 0.3–1.2)
Total Protein: 8.8 g/dL — ABNORMAL HIGH (ref 6.5–8.1)

## 2019-02-19 LAB — CBC
HCT: 42.5 % (ref 36.0–46.0)
Hemoglobin: 13.7 g/dL (ref 12.0–15.0)
MCH: 29.8 pg (ref 26.0–34.0)
MCHC: 32.2 g/dL (ref 30.0–36.0)
MCV: 92.6 fL (ref 80.0–100.0)
Platelets: 340 10*3/uL (ref 150–400)
RBC: 4.59 MIL/uL (ref 3.87–5.11)
RDW: 13.7 % (ref 11.5–15.5)
WBC: 12.3 10*3/uL — ABNORMAL HIGH (ref 4.0–10.5)
nRBC: 0 % (ref 0.0–0.2)

## 2019-02-19 LAB — URINALYSIS, ROUTINE W REFLEX MICROSCOPIC
Bilirubin Urine: NEGATIVE
Glucose, UA: NEGATIVE mg/dL
Hgb urine dipstick: NEGATIVE
Ketones, ur: NEGATIVE mg/dL
Leukocytes,Ua: NEGATIVE
Nitrite: NEGATIVE
Protein, ur: NEGATIVE mg/dL
Specific Gravity, Urine: 1.026 (ref 1.005–1.030)
pH: 6 (ref 5.0–8.0)

## 2019-02-19 LAB — LIPASE, BLOOD: Lipase: 37 U/L (ref 11–51)

## 2019-02-19 LAB — I-STAT BETA HCG BLOOD, ED (MC, WL, AP ONLY): I-stat hCG, quantitative: <5 m[IU]/mL (ref <5)

## 2019-02-19 MED ORDER — ONDANSETRON 4 MG PO TBDP: 4.0000 mg | ORAL_TABLET | Freq: Three times a day (TID) | ORAL | 0 refills | Status: AC | PRN

## 2019-02-19 MED ORDER — IOHEXOL 300 MG/ML  SOLN
100.0000 mL | Freq: Once | INTRAMUSCULAR | Status: AC | PRN
  Administered 2019-02-19: 21:00:00 100 mL via INTRAVENOUS

## 2019-02-19 MED ORDER — SODIUM CHLORIDE 0.9% FLUSH: 3.0000 mL | Freq: Once | INTRAVENOUS | Status: DC

## 2019-02-19 MED ORDER — HYDROCODONE-ACETAMINOPHEN 5-325 MG PO TABS: 1.0000 | ORAL_TABLET | ORAL | 0 refills | Status: AC | PRN

## 2019-02-19 MED ORDER — SODIUM CHLORIDE 0.9 % IV BOLUS
1000.0000 mL | Freq: Once | INTRAVENOUS | Status: AC
  Administered 2019-02-19: 19:00:00 1000 mL via INTRAVENOUS

## 2019-02-19 MED ORDER — SODIUM CHLORIDE (PF) 0.9 % IJ SOLN
INTRAMUSCULAR | Status: AC
  Filled 2019-02-19: qty 50

## 2019-02-19 MED ORDER — MORPHINE SULFATE (PF) 4 MG/ML IV SOLN
4.0000 mg | Freq: Once | INTRAVENOUS | Status: AC
  Administered 2019-02-19: 19:00:00 4 mg via INTRAVENOUS
  Filled 2019-02-19: qty 1

## 2019-02-19 MED ORDER — ONDANSETRON HCL 4 MG/2ML IJ SOLN
4.0000 mg | Freq: Once | INTRAMUSCULAR | Status: AC
  Administered 2019-02-19: 4 mg via INTRAVENOUS
  Filled 2019-02-19: qty 2

## 2019-02-19 MED ORDER — ONDANSETRON 4 MG PO TBDP
4.0000 mg | ORAL_TABLET | Freq: Once | ORAL | Status: AC | PRN
  Administered 2019-02-19: 4 mg via ORAL
  Filled 2019-02-19: qty 1

## 2019-02-19 NOTE — ED Triage Notes (Signed)
Patient c/o right lower abdominal pain, N/V, and chills x 2 days. Patient states the pain radiates from the RLQ around her back to the LLQ.

## 2019-02-19 NOTE — ED Provider Notes (Signed)
[facility] DEPT Provider Note   CSN: [id number] Arrival date & time: 02/19/19  1637    History   Chief Complaint Chief Complaint  Patient presents with  . Emesis  . Abdominal Pain    HPI Lisa Farley is a 48 y.o. female.     Pt presents to the ED today with abdominal pain and n/v.  The pt said she has been sick for 2 days.  She has been unable to keep anything down.  She has had chills, but no fever.  No known covid exposures.  No sob or cough.     Past Medical History:  Diagnosis Date  . Menorrhagia     Patient Active Problem List   Diagnosis Date Noted  . Pelvic pain in female 01/04/2013    Past Surgical History:  Procedure Laterality Date  . KNEE SURGERY    . TONSILLECTOMY    . TUBAL LIGATION    . WISDOM TOOTH EXTRACTION       OB History    Gravida  5   Para  3   Term  3   Preterm      AB  2   Living  3     SAB  1   TAB  1   Ectopic      Multiple      Live Births               Home Medications    Prior to Admission medications   Medication Sig Start Date End Date Taking? Authorizing Provider  levonorgestrel (MIRENA) 20 MCG/24HR IUD 1 each by Intrauterine route once. Inserted 02/13/2012   Yes Shelly Bombard, MD  lisinopril-hydrochlorothiazide (PRINZIDE,ZESTORETIC) 20-25 MG per tablet Take 1 tablet by mouth daily.   Yes [provider]  oxyCODONE-acetaminophen (PERCOCET) 10-325 MG tablet Take 1 tablet by mouth every 6 (six) hours as needed for pain.  08/19/16  Yes [provider]  ranitidine (ZANTAC) 300 MG tablet Take 300 mg by mouth daily. 08/17/16  Yes [provider]  dicyclomine (BENTYL) 20 MG tablet Take 1 tablet (20 mg total) by mouth 2 (two) times daily. Patient not taking: Reported on 02/19/2019 09/16/16   Junius Creamer, NP  gabapentin (NEURONTIN) 100 MG capsule Take 100 mg by mouth 3 (three) times daily as needed (leg pain).  06/15/16   [provider]   HYDROcodone-acetaminophen (NORCO/VICODIN) 5-325 MG tablet Take 1 tablet by mouth every 4 (four) hours as needed. 02/19/19   Isla Pence, MD  metroNIDAZOLE (FLAGYL) 500 MG tablet Take 1 tablet (500 mg total) by mouth 2 (two) times daily. Patient not taking: Reported on 09/15/2016 09/25/14   Shelly Bombard, MD  ondansetron (ZOFRAN ODT) 4 MG disintegrating tablet Take 1 tablet (4 mg total) by mouth every 8 (eight) hours as needed. 02/19/19   Isla Pence, MD  oxyCODONE-acetaminophen (PERCOCET/ROXICET) 5-325 MG per tablet Take 2 tablets by mouth every 6 (six) hours as needed for severe pain. Patient not taking: Reported on 09/15/2016 01/19/14   Junius Creamer, NP  tetrahydrozoline 0.05 % ophthalmic solution Place 1 drop into both eyes as needed (allergies).    [provider]    Family History Family History  Problem Relation Age of Onset  . Arthritis Mother   . Asthma Mother   . Hypertension Mother   . Asthma Son     Social History Social History   Tobacco Use  . Smoking status: Former Smoker  Packs/day: 0.25    Years: 1.00    Pack years: 0.25    Types: Cigarettes  . Smokeless tobacco: Never Used  Substance Use Topics  . Alcohol use: Yes    Alcohol/week: 2.0 standard drinks    Types: 2 Cans of beer per week    Comment: occasional  . Drug use: No     Allergies   Patient has no known allergies.   Review of Systems Review of Systems  Gastrointestinal: Positive for abdominal pain and nausea.  All other systems reviewed and are negative.    Physical Exam Updated Vital Signs BP 138/81   Pulse 64   Temp 98.5 F (36.9 C) (Oral)   Resp 16   Ht 5\' 5"  (1.651 m)   Wt 91.6 kg   SpO2 99%   BMI 33.61 kg/m   Physical Exam Vitals signs and nursing note reviewed.  Constitutional:      Appearance: She is well-developed.  HENT:     Head: Normocephalic and atraumatic.     Mouth/Throat:     Mouth: Mucous membranes are moist.     Pharynx: Oropharynx is clear.   Eyes:     Extraocular Movements: Extraocular movements intact.     Pupils: Pupils are equal, round, and reactive to light.  Cardiovascular:     Rate and Rhythm: Normal rate and regular rhythm.  Pulmonary:     Effort: Pulmonary effort is normal.     Breath sounds: Normal breath sounds.  Abdominal:     General: Abdomen is flat.     Palpations: Abdomen is soft.     Tenderness: There is abdominal tenderness in the right upper quadrant, right lower quadrant and left lower quadrant.  Skin:    Capillary Refill: Capillary refill takes less than 2 seconds.  Neurological:     General: No focal deficit present.     Mental Status: She is alert and oriented to person, place, and time.  Psychiatric:        Mood and Affect: Mood normal.        Behavior: Behavior normal.      ED Treatments / Results  Labs (all labs ordered are listed, but only abnormal results are displayed) Labs Reviewed  COMPREHENSIVE METABOLIC PANEL - Abnormal; Notable for the following components:      Result Value   Potassium 3.1 (*)    Total Protein 8.8 (*)    Albumin 5.1 (*)    All other components within normal limits  CBC - Abnormal; Notable for the following components:   WBC 12.3 (*)    All other components within normal limits  URINALYSIS, ROUTINE W REFLEX MICROSCOPIC - Abnormal; Notable for the following components:   APPearance HAZY (*)    All other components within normal limits  LIPASE, BLOOD  I-STAT BETA HCG BLOOD, ED ([facility], [facility], [facility] ONLY)    EKG None  Radiology Ct Abdomen Pelvis W Contrast  Result Date: 02/19/2019 CLINICAL DATA:  Acute right lower quadrant abdominal pain. EXAM: CT ABDOMEN AND PELVIS WITH CONTRAST TECHNIQUE: Multidetector CT imaging of the abdomen and pelvis was performed using the standard protocol following bolus administration of intravenous contrast. CONTRAST:  100mL OMNIPAQUE IOHEXOL 300 MG/ML  SOLN COMPARISON:  CT scan of September 15, 2016. FINDINGS: Lower chest: No acute  abnormality. Hepatobiliary: No focal liver abnormality is seen. No gallstones, gallbladder wall thickening, or biliary dilatation. Pancreas: Unremarkable. No pancreatic ductal dilatation or surrounding inflammatory changes. Spleen: Normal in size without focal abnormality.  Adrenals/Urinary Tract: Adrenal glands are unremarkable. Kidneys are normal, without renal calculi, focal lesion, or hydronephrosis. Bladder is unremarkable. Stomach/Bowel: Stomach is within normal limits. Appendix appears normal. No evidence of bowel wall thickening, distention, or inflammatory changes. Vascular/Lymphatic: No significant vascular findings are present. No enlarged abdominal or pelvic lymph nodes. Reproductive: Intrauterine device is noted. No adnexal abnormality is noted. Other: No abdominal wall hernia or abnormality. No abdominopelvic ascites. Musculoskeletal: No acute or significant osseous findings. IMPRESSION: No acute abnormality seen in the abdomen or pelvis. Electronically Signed   By: Lupita RaiderJames  Green Jr M.D.   On: 02/19/2019 21:27    Procedures Procedures (including critical care time)  Medications Ordered in ED Medications  ondansetron (ZOFRAN-ODT) disintegrating tablet 4 mg (4 mg Oral Given 02/19/19 1719)  sodium chloride 0.9 % bolus 1,000 mL (0 mLs Intravenous Stopped 02/19/19 2048)  ondansetron (ZOFRAN) injection 4 mg (4 mg Intravenous Given 02/19/19 1924)  morphine 4 MG/ML injection 4 mg (4 mg Intravenous Given 02/19/19 1924)  iohexol (OMNIPAQUE) 300 MG/ML solution 100 mL (100 mLs Intravenous Contrast Given 02/19/19 2100)     Initial Impression / Assessment and Plan / ED Course  I have reviewed the triage vital signs and the nursing notes.  Pertinent labs & imaging results that were available during my care of the patient were reviewed by me and considered in my medical decision making (see chart for details).       Pt is feeling much better.  She is able to tolerate po fluids.  CT abd/pelvis without  anything acute.  Pt is stable for d/c.  Return if worse.  Final Clinical Impressions(s) / ED Diagnoses   Final diagnoses:  Generalized abdominal pain  Non-intractable vomiting with nausea, unspecified vomiting type  Dehydration    ED Discharge Orders         Ordered    ondansetron (ZOFRAN ODT) 4 MG disintegrating tablet  Every 8 hours PRN     02/19/19 2137    HYDROcodone-acetaminophen (NORCO/VICODIN) 5-325 MG tablet  Every 4 hours PRN     02/19/19 2137           Jacalyn LefevreHaviland, Shahin Knierim, MD 02/19/19 2137

## 2019-02-19 NOTE — ED Notes (Signed)
Bed: QA44 Expected date:  Expected time:  Means of arrival:  Comments: EMS-48yo rectal pain

## 2019-02-19 NOTE — ED Notes (Addendum)
Patient transported to CT 

## 2020-02-24 ENCOUNTER — Other Ambulatory Visit: Payer: Self-pay

## 2020-02-24 ENCOUNTER — Emergency Department (HOSPITAL_COMMUNITY)
Admission: EM | Admit: 2020-02-24 | Discharge: 2020-02-24 | Discharge status: Against Medical Advice | Payer: Medicaid Other | Attending: Emergency Medicine | Admitting: Emergency Medicine

## 2020-02-24 ENCOUNTER — Encounter (HOSPITAL_COMMUNITY): Payer: Self-pay

## 2020-02-24 DIAGNOSIS — R109 Unspecified abdominal pain: Secondary | ICD-10-CM | POA: Diagnosis not present

## 2020-02-24 LAB — COMPREHENSIVE METABOLIC PANEL
ALT: 18 U/L (ref 0–44)
AST: 20 U/L (ref 15–41)
Albumin: 5 g/dL (ref 3.5–5.0)
Alkaline Phosphatase: 67 U/L (ref 38–126)
Anion gap: 13 (ref 5–15)
BUN: 14 mg/dL (ref 6–20)
CO2: 23 mmol/L (ref 22–32)
Calcium: 10.2 mg/dL (ref 8.9–10.3)
Chloride: 106 mmol/L (ref 98–111)
Creatinine, Ser: 0.58 mg/dL (ref 0.44–1.00)
GFR calc Af Amer: >60 mL/min (ref >60)
GFR calc non Af Amer: >60 mL/min (ref >60)
Glucose, Bld: 128 mg/dL — ABNORMAL HIGH (ref 70–99)
Potassium: 3.5 mmol/L (ref 3.5–5.1)
Sodium: 142 mmol/L (ref 135–145)
Total Bilirubin: 0.4 mg/dL (ref 0.3–1.2)
Total Protein: 8.6 g/dL — ABNORMAL HIGH (ref 6.5–8.1)

## 2020-02-24 LAB — CBC
HCT: 41.9 % (ref 36.0–46.0)
Hemoglobin: 13.4 g/dL (ref 12.0–15.0)
MCH: 29.8 pg (ref 26.0–34.0)
MCHC: 32 g/dL (ref 30.0–36.0)
MCV: 93.3 fL (ref 80.0–100.0)
Platelets: 320 10*3/uL (ref 150–400)
RBC: 4.49 MIL/uL (ref 3.87–5.11)
RDW: 13.4 % (ref 11.5–15.5)
WBC: 10 10*3/uL (ref 4.0–10.5)
nRBC: 0 % (ref 0.0–0.2)

## 2020-02-24 LAB — I-STAT BETA HCG BLOOD, ED (MC, WL, AP ONLY): I-stat hCG, quantitative: <5 m[IU]/mL (ref <5)

## 2020-02-24 LAB — LIPASE, BLOOD: Lipase: 21 U/L (ref 11–51)

## 2020-02-24 MED ORDER — ONDANSETRON 4 MG PO TBDP
4.0000 mg | ORAL_TABLET | Freq: Once | ORAL | Status: AC | PRN
  Administered 2020-02-24: 4 mg via ORAL
  Filled 2020-02-24: qty 1

## 2020-02-24 MED ORDER — SODIUM CHLORIDE 0.9% FLUSH: 3.0000 mL | Freq: Once | INTRAVENOUS | Status: DC

## 2020-02-24 NOTE — ED Notes (Addendum)
Pt called for rooming! No answer x3! 

## 2020-02-24 NOTE — ED Triage Notes (Signed)
Patient c/o mid abdominal pain, N/v/D since this AM.

## 2021-12-20 ENCOUNTER — Emergency Department (HOSPITAL_COMMUNITY): Payer: Medicaid Other

## 2021-12-20 ENCOUNTER — Emergency Department (HOSPITAL_COMMUNITY)
Admission: EM | Admit: 2021-12-20 | Discharge: 2021-12-20 | Disposition: A | Discharge status: Home / Self Care | Payer: Medicaid Other | Attending: Emergency Medicine | Admitting: Emergency Medicine

## 2021-12-20 ENCOUNTER — Encounter (HOSPITAL_COMMUNITY): Payer: Self-pay

## 2021-12-20 DIAGNOSIS — R1011 Right upper quadrant pain: Secondary | ICD-10-CM | POA: Insufficient documentation

## 2021-12-20 DIAGNOSIS — E876 Hypokalemia: Secondary | ICD-10-CM | POA: Insufficient documentation

## 2021-12-20 DIAGNOSIS — D72829 Elevated white blood cell count, unspecified: Secondary | ICD-10-CM | POA: Diagnosis not present

## 2021-12-20 DIAGNOSIS — R197 Diarrhea, unspecified: Secondary | ICD-10-CM | POA: Insufficient documentation

## 2021-12-20 DIAGNOSIS — R6883 Chills (without fever): Secondary | ICD-10-CM | POA: Insufficient documentation

## 2021-12-20 DIAGNOSIS — R1013 Epigastric pain: Secondary | ICD-10-CM | POA: Diagnosis not present

## 2021-12-20 DIAGNOSIS — R112 Nausea with vomiting, unspecified: Secondary | ICD-10-CM | POA: Insufficient documentation

## 2021-12-20 LAB — CBC WITH DIFFERENTIAL/PLATELET
Abs Immature Granulocytes: 0.07 10*3/uL (ref 0.00–0.07)
Basophils Absolute: 0 10*3/uL (ref 0.0–0.1)
Basophils Relative: 0 %
Eosinophils Absolute: 0 10*3/uL (ref 0.0–0.5)
Eosinophils Relative: 0 %
HCT: 43.3 % (ref 36.0–46.0)
Hemoglobin: 13.9 g/dL (ref 12.0–15.0)
Immature Granulocytes: 1 %
Lymphocytes Relative: 18 %
Lymphs Abs: 2.1 10*3/uL (ref 0.7–4.0)
MCH: 29.7 pg (ref 26.0–34.0)
MCHC: 32.1 g/dL (ref 30.0–36.0)
MCV: 92.5 fL (ref 80.0–100.0)
Monocytes Absolute: 0.5 10*3/uL (ref 0.1–1.0)
Monocytes Relative: 4 %
Neutro Abs: 8.7 10*3/uL — ABNORMAL HIGH (ref 1.7–7.7)
Neutrophils Relative %: 77 %
Platelets: 337 10*3/uL (ref 150–400)
RBC: 4.68 MIL/uL (ref 3.87–5.11)
RDW: 13.2 % (ref 11.5–15.5)
WBC: 11.4 10*3/uL — ABNORMAL HIGH (ref 4.0–10.5)
nRBC: 0 % (ref 0.0–0.2)

## 2021-12-20 LAB — COMPREHENSIVE METABOLIC PANEL
ALT: 13 U/L (ref 0–44)
AST: 22 U/L (ref 15–41)
Albumin: 4.8 g/dL (ref 3.5–5.0)
Alkaline Phosphatase: 78 U/L (ref 38–126)
Anion gap: 13 (ref 5–15)
BUN: 8 mg/dL (ref 6–20)
CO2: 23 mmol/L (ref 22–32)
Calcium: 10.1 mg/dL (ref 8.9–10.3)
Chloride: 102 mmol/L (ref 98–111)
Creatinine, Ser: 0.67 mg/dL (ref 0.44–1.00)
GFR, Estimated: >60 mL/min (ref >60)
Glucose, Bld: 102 mg/dL — ABNORMAL HIGH (ref 70–99)
Potassium: 3.3 mmol/L — ABNORMAL LOW (ref 3.5–5.1)
Sodium: 138 mmol/L (ref 135–145)
Total Bilirubin: 0.7 mg/dL (ref 0.3–1.2)
Total Protein: 8.9 g/dL — ABNORMAL HIGH (ref 6.5–8.1)

## 2021-12-20 LAB — LIPASE, BLOOD: Lipase: 26 U/L (ref 11–51)

## 2021-12-20 LAB — HCG, QUANTITATIVE, PREGNANCY: hCG, Beta Chain, Quant, S: 1 m[IU]/mL (ref <5)

## 2021-12-20 LAB — URINALYSIS, ROUTINE W REFLEX MICROSCOPIC
Bilirubin Urine: NEGATIVE
Glucose, UA: NEGATIVE mg/dL
Ketones, ur: 20 mg/dL — AB
Leukocytes,Ua: NEGATIVE
Nitrite: NEGATIVE
Protein, ur: 100 mg/dL — AB
Specific Gravity, Urine: 1.01 (ref 1.005–1.030)
pH: 5 (ref 5.0–8.0)

## 2021-12-20 MED ORDER — SODIUM CHLORIDE 0.9 % IV BOLUS
1000.0000 mL | Freq: Once | INTRAVENOUS | Status: AC
  Administered 2021-12-20: 1000 mL via INTRAVENOUS

## 2021-12-20 MED ORDER — ONDANSETRON HCL 4 MG/2ML IJ SOLN
4.0000 mg | Freq: Once | INTRAMUSCULAR | Status: AC
  Administered 2021-12-20: 4 mg via INTRAVENOUS
  Filled 2021-12-20: qty 2

## 2021-12-20 MED ORDER — ONDANSETRON HCL 4 MG PO TABS: 4.0000 mg | ORAL_TABLET | Freq: Four times a day (QID) | ORAL | 0 refills | Status: AC

## 2021-12-20 MED ORDER — DICYCLOMINE HCL 20 MG PO TABS: 20.0000 mg | ORAL_TABLET | Freq: Two times a day (BID) | ORAL | 0 refills | Status: AC

## 2021-12-20 MED ORDER — IOHEXOL 300 MG/ML  SOLN
100.0000 mL | Freq: Once | INTRAMUSCULAR | Status: AC | PRN
  Administered 2021-12-20: 100 mL via INTRAVENOUS

## 2021-12-20 MED ORDER — FENTANYL CITRATE PF 50 MCG/ML IJ SOSY
50.0000 ug | PREFILLED_SYRINGE | Freq: Once | INTRAMUSCULAR | Status: AC
  Administered 2021-12-20: 50 ug via INTRAVENOUS
  Filled 2021-12-20: qty 1

## 2021-12-20 NOTE — ED Triage Notes (Signed)
Pt arrived via POV c/o diarrhea and vomiting last night. States diarrhea has subsided, vomiting cont. Denies any urinary sx.  ?

## 2021-12-20 NOTE — ED Provider Notes (Signed)
?[facility]-EMERGENCY DEPT ?Provider Note ? ? ?CSN: [id number] ?Arrival date & time: 12/20/21  1644 ? ?  ? ?History ? ?Chief Complaint  ?Patient presents with  ? Emesis  ? ? ?Lisa Farley is a 51 y.o. female.  With no significant past medical history presents emergency department with nausea vomiting and diarrhea. ? ?Patient states that symptoms began last night.  She states that she had about 3 hours of nonbloody diarrhea with innumerable episodes of vomiting.  She states that her diarrhea is decided however she continues to have nausea and vomiting.  Also states she is having right-sided abdominal pain that radiates to her back.  She denies any fevers, denies eating any possibility of contaminated foods.  No recent travel.  Denies previous abdominal surgeries.  Denies urinary symptoms or vaginal discharge. ? ?Emesis ?Associated symptoms: abdominal pain, chills and diarrhea   ?Associated symptoms: no fever   ? ?  ? ?Home Medications ?Prior to Admission medications   ?Medication Sig Start Date End Date Taking? Authorizing Provider  ?dicyclomine (BENTYL) 20 MG tablet Take 1 tablet (20 mg total) by mouth 2 (two) times daily. ?Patient not taking: Reported on 02/19/2019 09/16/16   Earley Favor, NP  ?gabapentin (NEURONTIN) 100 MG capsule Take 100 mg by mouth 3 (three) times daily as needed (leg pain).  06/15/16   [provider]  ?HYDROcodone-acetaminophen (NORCO/VICODIN) 5-325 MG tablet Take 1 tablet by mouth every 4 (four) hours as needed. 02/19/19   Jacalyn Lefevre, MD  ?levonorgestrel (MIRENA) 20 MCG/24HR IUD 1 each by Intrauterine route once. Inserted 02/13/2012    Brock Bad, MD  ?lisinopril-hydrochlorothiazide (PRINZIDE,ZESTORETIC) 20-25 MG per tablet Take 1 tablet by mouth daily.    [provider]  ?metroNIDAZOLE (FLAGYL) 500 MG tablet Take 1 tablet (500 mg total) by mouth 2 (two) times daily. ?Patient not taking: Reported on 09/15/2016 09/25/14   Brock Bad, MD   ?ondansetron (ZOFRAN ODT) 4 MG disintegrating tablet Take 1 tablet (4 mg total) by mouth every 8 (eight) hours as needed. 02/19/19   Jacalyn Lefevre, MD  ?oxyCODONE-acetaminophen (PERCOCET) 10-325 MG tablet Take 1 tablet by mouth every 6 (six) hours as needed for pain.  08/19/16   [provider]  ?oxyCODONE-acetaminophen (PERCOCET/ROXICET) 5-325 MG per tablet Take 2 tablets by mouth every 6 (six) hours as needed for severe pain. ?Patient not taking: Reported on 09/15/2016 01/19/14   Earley Favor, NP  ?ranitidine (ZANTAC) 300 MG tablet Take 300 mg by mouth daily. 08/17/16   [provider]  ?tetrahydrozoline 0.05 % ophthalmic solution Place 1 drop into both eyes as needed (allergies).    [provider]  ?   ? ?Allergies    ?Patient has no known allergies.   ? ?Review of Systems   ?Review of Systems  ?Constitutional:  Positive for appetite change, chills and fatigue. Negative for fever.  ?Gastrointestinal:  Positive for abdominal pain, diarrhea, nausea and vomiting. Negative for blood in stool.  ?Genitourinary:  Positive for flank pain. Negative for dysuria and vaginal discharge.  ?Neurological:  Negative for dizziness and light-headedness.  ?All other systems reviewed and are negative. ? ?Physical Exam ?Updated Vital Signs ?BP (!) 154/117 (BP Location: Left Arm)   Pulse 84   Temp 98.4 ?F (36.9 ?C) (Axillary)   Resp 20   Ht 5\' 5"  (1.651 m)   Wt 93.9 kg   SpO2 100%   BMI 34.45 kg/m?  ?Physical Exam ?Vitals and nursing note reviewed.  ?Constitutional:   ?  General: She is not in acute distress. ?   Appearance: Normal appearance. She is obese. She is ill-appearing.  ?HENT:  ?   Head: Normocephalic and atraumatic.  ?   Nose: Nose normal.  ?   Mouth/Throat:  ?   Mouth: Mucous membranes are moist.  ?   Pharynx: Oropharynx is clear.  ?Eyes:  ?   General: No scleral icterus. ?   Extraocular Movements: Extraocular movements intact.  ?Cardiovascular:  ?   Rate and Rhythm: Normal rate and regular  rhythm.  ?   Pulses: Normal pulses.  ?   Heart sounds: No murmur heard. ?Pulmonary:  ?   Effort: Pulmonary effort is normal. No respiratory distress.  ?   Breath sounds: Normal breath sounds.  ?Abdominal:  ?   General: Abdomen is protuberant. Bowel sounds are decreased. There is no distension.  ?   Palpations: Abdomen is soft.  ?   Tenderness: There is abdominal tenderness in the right upper quadrant and epigastric area. There is no right CVA tenderness or left CVA tenderness. Positive signs include Murphy's sign.  ?   Hernia: No hernia is present.  ?Musculoskeletal:     ?   General: Normal range of motion.  ?   Cervical back: Neck supple.  ?Skin: ?   General: Skin is warm and dry.  ?   Capillary Refill: Capillary refill takes less than 2 seconds.  ?   Findings: No rash.  ?Neurological:  ?   General: No focal deficit present.  ?   Mental Status: She is alert and oriented to person, place, and time. Mental status is at baseline.  ?Psychiatric:     ?   Mood and Affect: Mood normal.     ?   Behavior: Behavior normal.     ?   Thought Content: Thought content normal.     ?   Judgment: Judgment normal.  ? ? ?ED Results / Procedures / Treatments   ?Labs ?(all labs ordered are listed, but only abnormal results are displayed) ?Labs Reviewed  ?CBC WITH DIFFERENTIAL/PLATELET - Abnormal; Notable for the following components:  ?    Result Value  ? WBC 11.4 (*)   ? Neutro Abs 8.7 (*)   ? All other components within normal limits  ?URINALYSIS, ROUTINE W REFLEX MICROSCOPIC - Abnormal; Notable for the following components:  ? Hgb urine dipstick SMALL (*)   ? Ketones, ur 20 (*)   ? Protein, ur 100 (*)   ? Bacteria, UA FEW (*)   ? All other components within normal limits  ?COMPREHENSIVE METABOLIC PANEL - Abnormal; Notable for the following components:  ? Potassium 3.3 (*)   ? Glucose, Bld 102 (*)   ? Total Protein 8.9 (*)   ? All other components within normal limits  ?HCG, QUANTITATIVE, PREGNANCY  ?LIPASE, BLOOD   ? ?EKG ?None ? ?Radiology ?CT Abdomen Pelvis W Contrast ? ?Result Date: 12/20/2021 ?CLINICAL DATA:  Diarrhea and vomiting. EXAM: CT ABDOMEN AND PELVIS WITH CONTRAST TECHNIQUE: Multidetector CT imaging of the abdomen and pelvis was performed using the standard protocol following bolus administration of intravenous contrast. RADIATION DOSE REDUCTION: This exam was performed according to the departmental dose-optimization program which includes automated exposure control, adjustment of the mA and/or kV according to patient size and/or use of iterative reconstruction technique. CONTRAST:  100mL OMNIPAQUE IOHEXOL 300 MG/ML  SOLN COMPARISON:  February 19, 2019 FINDINGS: Lower chest: No acute abnormality. Hepatobiliary: No focal liver abnormality is seen. No gallstones,  gallbladder wall thickening, or biliary dilatation. Pancreas: Unremarkable. No pancreatic ductal dilatation or surrounding inflammatory changes. Spleen: Normal in size without focal abnormality. Adrenals/Urinary Tract: Adrenal glands are unremarkable. Kidneys are normal in size, without renal calculi or focal lesions. Mild prominence of the bilateral renal pelvis is seen. Bladder is unremarkable. Stomach/Bowel: There is a small hiatal hernia. The appendix is poorly visualized. No evidence of bowel wall thickening, distention, or inflammatory changes. Vascular/Lymphatic: No significant vascular findings are present. No enlarged abdominal or pelvic lymph nodes. Reproductive: An IUD is in place. The uterus and bilateral adnexa are otherwise unremarkable. Other: No abdominal wall hernia or abnormality. No abdominopelvic ascites. Musculoskeletal: No acute or significant osseous findings. IMPRESSION: 1. Small hiatal hernia. 2. No acute or active process within the abdomen or pelvis. Electronically Signed   By: Aram Candela M.D.   On: 12/20/2021 21:00  ? ?US Abdomen Limited RUQ (LIVER/GB) ? ?Result Date: 12/20/2021 ?CLINICAL DATA:  Right upper quadrant abdominal  pain. EXAM: ULTRASOUND ABDOMEN LIMITED RIGHT UPPER QUADRANT COMPARISON:  February 19, 2019.  March 04, 2005. FINDINGS: Gallbladder: No gallstones or wall thickening visualized. No sonographic Murphy sign noted by sonographer. Mild amount of sl

## 2021-12-20 NOTE — Discharge Instructions (Addendum)
You were seen in the emergency department for nausea, vomiting and diarrhea. You likely have a viral gastroenteritis. I am prescribing you Zofran for nausea and Bentyl for diarrhea. Please continue to drink plenty of fluids. Please return for worsening abdominal pain with fever.  ?

## 2021-12-20 NOTE — ED Notes (Signed)
Per [phone] main lab- recollect needed for light green. ENMiles ?

## 2024-02-17 ENCOUNTER — Emergency Department (HOSPITAL_COMMUNITY)
Admission: EM | Admit: 2024-02-17 | Discharge: 2024-02-18 | Disposition: A | Discharge status: Home / Self Care | Attending: Emergency Medicine | Admitting: Emergency Medicine

## 2024-02-17 ENCOUNTER — Encounter (HOSPITAL_COMMUNITY): Payer: Self-pay

## 2024-02-17 ENCOUNTER — Emergency Department (HOSPITAL_COMMUNITY)

## 2024-02-17 DIAGNOSIS — I10 Essential (primary) hypertension: Secondary | ICD-10-CM | POA: Diagnosis present

## 2024-02-17 DIAGNOSIS — R519 Headache, unspecified: Secondary | ICD-10-CM | POA: Insufficient documentation

## 2024-02-17 DIAGNOSIS — Z720 Tobacco use: Secondary | ICD-10-CM | POA: Insufficient documentation

## 2024-02-17 DIAGNOSIS — R002 Palpitations: Secondary | ICD-10-CM | POA: Insufficient documentation

## 2024-02-17 LAB — CBC WITH DIFFERENTIAL/PLATELET
Abs Immature Granulocytes: 0.02 10*3/uL (ref 0.00–0.07)
Basophils Absolute: 0 10*3/uL (ref 0.0–0.1)
Basophils Relative: 0 %
Eosinophils Absolute: 0.1 10*3/uL (ref 0.0–0.5)
Eosinophils Relative: 1 %
HCT: 45.3 % (ref 36.0–46.0)
Hemoglobin: 14.7 g/dL (ref 12.0–15.0)
Immature Granulocytes: 0 %
Lymphocytes Relative: 49 %
Lymphs Abs: 4.7 10*3/uL — ABNORMAL HIGH (ref 0.7–4.0)
MCH: 30.1 pg (ref 26.0–34.0)
MCHC: 32.5 g/dL (ref 30.0–36.0)
MCV: 92.6 fL (ref 80.0–100.0)
Monocytes Absolute: 0.8 10*3/uL (ref 0.1–1.0)
Monocytes Relative: 8 %
Neutro Abs: 4.1 10*3/uL (ref 1.7–7.7)
Neutrophils Relative %: 42 %
Platelets: 352 10*3/uL (ref 150–400)
RBC: 4.89 MIL/uL (ref 3.87–5.11)
RDW: 14 % (ref 11.5–15.5)
WBC: 9.6 10*3/uL (ref 4.0–10.5)
nRBC: 0 % (ref 0.0–0.2)

## 2024-02-17 LAB — COMPREHENSIVE METABOLIC PANEL WITH GFR
ALT: 19 U/L (ref 0–44)
AST: 27 U/L (ref 15–41)
Albumin: 4.7 g/dL (ref 3.5–5.0)
Alkaline Phosphatase: 79 U/L (ref 38–126)
Anion gap: 12 (ref 5–15)
BUN: 18 mg/dL (ref 6–20)
CO2: 26 mmol/L (ref 22–32)
Calcium: 10 mg/dL (ref 8.9–10.3)
Chloride: 100 mmol/L (ref 98–111)
Creatinine, Ser: 1.01 mg/dL — ABNORMAL HIGH (ref 0.44–1.00)
GFR, Estimated: >60 mL/min (ref >60)
Glucose, Bld: 115 mg/dL — ABNORMAL HIGH (ref 70–99)
Potassium: 3.8 mmol/L (ref 3.5–5.1)
Sodium: 138 mmol/L (ref 135–145)
Total Bilirubin: 0.8 mg/dL (ref 0.0–1.2)
Total Protein: 9 g/dL — ABNORMAL HIGH (ref 6.5–8.1)

## 2024-02-17 LAB — I-STAT CHEM 8, ED
BUN: 17 mg/dL (ref 6–20)
Calcium, Ion: 1.2 mmol/L (ref 1.15–1.40)
Chloride: 103 mmol/L (ref 98–111)
Creatinine, Ser: 1 mg/dL (ref 0.44–1.00)
Glucose, Bld: 119 mg/dL — ABNORMAL HIGH (ref 70–99)
HCT: 44 % (ref 36.0–46.0)
Hemoglobin: 15 g/dL (ref 12.0–15.0)
Potassium: 3.6 mmol/L (ref 3.5–5.1)
Sodium: 138 mmol/L (ref 135–145)
TCO2: 24 mmol/L (ref 22–32)

## 2024-02-17 LAB — CBG MONITORING, ED: Glucose-Capillary: 123 mg/dL — ABNORMAL HIGH (ref 70–99)

## 2024-02-17 LAB — TROPONIN I (HIGH SENSITIVITY): Troponin I (High Sensitivity): 6 ng/L (ref <18)

## 2024-02-17 MED ORDER — IOHEXOL 350 MG/ML SOLN
75.0000 mL | Freq: Once | INTRAVENOUS | Status: AC | PRN
  Administered 2024-02-17: 75 mL via INTRAVENOUS

## 2024-02-17 NOTE — ED Triage Notes (Addendum)
 States her blood pressure is high and she felt like her heart is beating out of her chest. Also states her right arm is on fire. States it stared about 3 hours ago. Patient takes lisinopril 20/25 and amlodipine unknown mg. States she took her medication today. LKW: 1500. Fire feeling in her right arm started around 5pm. Also reports headache above the right eye and blurred vision.

## 2024-02-17 NOTE — ED Provider Notes (Incomplete)
 [name] [facility] AT [facility] Provider Note   CSN: [id number] Arrival date & time: 02/17/24  2114     History {Add pertinent medical, surgical, social history, OB history to HPI:1} Chief Complaint  Patient presents with  . Hypertension    Lisa Farley is a 53 y.o. female.   Hypertension Associated symptoms include headaches.  Patient presents for multiple complaints.  Medical history includes HTN, tobacco use, menorrhagia.  Current home medications include amlodipine, lisinopril, HCTZ.  She states that her blood pressure has been elevated for the past 2 months.  Earlier today, she was in her normal state of health.  This evening, she was outside for approximately 15 minutes.  She was taking out the trash and getting her mail.  She began to have sensation of a rapid and pounding heart.  She has had a burning sensation on her left anterior forearm.  She has a generalized headache.  Since she initially arrived in the ED, most symptoms have subsided.  She does continue to have burning pain in her arm and a generalized headache.  She denies any other current symptoms at this time.      Home Medications Prior to Admission medications   Medication Sig Start Date End Date Taking? Authorizing Provider  dicyclomine  (BENTYL ) 20 MG tablet Take 1 tablet (20 mg total) by mouth 2 (two) times daily. 12/20/21   Autry, Lauren E, PA-C  gabapentin (NEURONTIN) 100 MG capsule Take 100 mg by mouth 3 (three) times daily as needed (leg pain).  06/15/16   [provider]  HYDROcodone -acetaminophen  (NORCO/VICODIN) 5-325 MG tablet Take 1 tablet by mouth every 4 (four) hours as needed. 02/19/19   Haviland, Julie, MD  levonorgestrel (MIRENA) 20 MCG/24HR IUD 1 each by Intrauterine route once. Inserted 02/13/2012    Gabrielle Joiner, MD  lisinopril-hydrochlorothiazide (PRINZIDE,ZESTORETIC) 20-25 MG per tablet Take 1 tablet by mouth daily.    [provider]  metroNIDAZOLE   (FLAGYL ) 500 MG tablet Take 1 tablet (500 mg total) by mouth 2 (two) times daily. Patient not taking: Reported on 09/15/2016 09/25/14   Gabrielle Joiner, MD  ondansetron  (ZOFRAN  ODT) 4 MG disintegrating tablet Take 1 tablet (4 mg total) by mouth every 8 (eight) hours as needed. 02/19/19   Sueellen Emery, MD  ondansetron  (ZOFRAN ) 4 MG tablet Take 1 tablet (4 mg total) by mouth every 6 (six) hours. 12/20/21   Autry, Lauren E, PA-C  oxyCODONE -acetaminophen  (PERCOCET) 10-325 MG tablet Take 1 tablet by mouth every 6 (six) hours as needed for pain.  08/19/16   [provider]  oxyCODONE -acetaminophen  (PERCOCET/ROXICET) 5-325 MG per tablet Take 2 tablets by mouth every 6 (six) hours as needed for severe pain. Patient not taking: Reported on 09/15/2016 01/19/14   Franceen Inches, NP  ranitidine (ZANTAC) 300 MG tablet Take 300 mg by mouth daily. 08/17/16   [provider]  tetrahydrozoline 0.05 % ophthalmic solution Place 1 drop into both eyes as needed (allergies).    [provider]      Allergies    Patient has no known allergies.    Review of Systems   Review of Systems  Cardiovascular:  Positive for palpitations.  Neurological:  Positive for headaches.  All other systems reviewed and are negative.   Physical Exam Updated Vital Signs BP (!) 185/136 (BP Location: Right Arm)   Pulse 98   Temp 98.3 F (36.8 C) (Oral)   Resp 17   SpO2 100%  Physical Exam  Vitals and nursing note reviewed.  Constitutional:      General: She is not in acute distress.    Appearance: Normal appearance. She is well-developed. She is not ill-appearing, toxic-appearing or diaphoretic.  HENT:     Head: Normocephalic and atraumatic.     Right Ear: External ear normal.     Left Ear: External ear normal.     Nose: Nose normal.     Mouth/Throat:     Mouth: Mucous membranes are moist.  Eyes:     Extraocular Movements: Extraocular movements intact.     Conjunctiva/sclera: Conjunctivae normal.   Cardiovascular:     Rate and Rhythm: Normal rate and regular rhythm.  Pulmonary:     Effort: Pulmonary effort is normal. No respiratory distress.  Abdominal:     General: There is no distension.     Palpations: Abdomen is soft.     Tenderness: There is no abdominal tenderness.  Musculoskeletal:        General: No swelling or deformity.     Cervical back: Normal range of motion and neck supple.  Skin:    General: Skin is warm and dry.     Coloration: Skin is not jaundiced or pale.  Neurological:     General: No focal deficit present.     Mental Status: She is alert and oriented to person, place, and time.     Cranial Nerves: No cranial nerve deficit.     Sensory: No sensory deficit.     Motor: No weakness.     Coordination: Coordination normal.  Psychiatric:        Mood and Affect: Mood normal.        Behavior: Behavior normal.     ED Results / Procedures / Treatments   Labs (all labs ordered are listed, but only abnormal results are displayed) Labs Reviewed  CBC WITH DIFFERENTIAL/PLATELET - Abnormal; Notable for the following components:      Result Value   Lymphs Abs 4.7 (*)    All other components within normal limits  COMPREHENSIVE METABOLIC PANEL WITH GFR - Abnormal; Notable for the following components:   Glucose, Bld 115 (*)    Creatinine, Ser 1.01 (*)    Total Protein 9.0 (*)    All other components within normal limits  I-STAT CHEM 8, ED - Abnormal; Notable for the following components:   Glucose, Bld 119 (*)    All other components within normal limits  CBG MONITORING, ED - Abnormal; Notable for the following components:   Glucose-Capillary 123 (*)    All other components within normal limits  TROPONIN I (HIGH SENSITIVITY)  TROPONIN I (HIGH SENSITIVITY)    EKG None  Radiology No results found.  Procedures Procedures  {Document cardiac monitor, telemetry assessment procedure when appropriate:1}  Medications Ordered in ED Medications  iohexol   (OMNIPAQUE ) 350 MG/ML injection 75 mL (75 mLs Intravenous Contrast Given 02/17/24 2320)    ED Course/ Medical Decision Making/ A&P   {   Click here for ABCD2, HEART and other calculatorsREFRESH Note before signing :1}                              Medical Decision Making Amount and/or Complexity of Data Reviewed Labs: ordered.   This patient presents to the ED for concern of ***, this involves an extensive number of treatment options, and is a complaint that carries with it a high risk of complications and morbidity.  The differential diagnosis includes ***   Co morbidities / Chronic conditions that complicate the patient evaluation  ***   Additional history obtained:  Additional history obtained from EMR External records from outside source obtained and reviewed including ***   Lab Tests:  I Ordered, and personally interpreted labs.  The pertinent results include:  ***   Imaging Studies ordered:  I ordered imaging studies including ***  I independently visualized and interpreted imaging which showed *** I agree with the radiologist interpretation   Cardiac Monitoring: / EKG:  The patient was maintained on a cardiac monitor.  I personally viewed and interpreted the cardiac monitored which showed an underlying rhythm of: ***   Problem List / ED Course / Critical interventions / Medication management  Patient presenting for onset of rapid pounding heart rate, headache, burning pain in distal left anterior arm.  On arrival in the ED, vital signs are notable for hypertension.  Initial EKG did capture a tachycardia with a rate of I ordered medication including ***   Reevaluation of the patient after these medicines showed that the patient *** I have reviewed the patients home medicines and have made adjustments as needed   Consultations Obtained:  I requested consultation with the ***,  and discussed lab and imaging findings as well as pertinent plan - they recommend:  ***   Social Determinants of Health:  ***   Test / Admission - Considered:  ***   {Document critical care time when appropriate:1} {Document review of labs and clinical decision tools ie heart score, Chads2Vasc2 etc:1}  {Document your independent review of radiology images, and any outside records:1} {Document your discussion with family members, caretakers, and with consultants:1} {Document social determinants of health affecting pt's care:1} {Document your decision making why or why not admission, treatments were needed:1} Final Clinical Impression(s) / ED Diagnoses Final diagnoses:  None    Rx / DC Orders ED Discharge Orders     None

## 2024-02-17 NOTE — ED Provider Triage Note (Signed)
 Emergency Medicine Provider Triage Evaluation Note  Lisa Farley , a 53 y.o. female  was evaluated in triage.  Pt complains of pain in her left arm that she describes as "fire.  Patient says that she felt her heart beating, like it was going the back of her chest.  She has not any specific chest pain or shortness of breath.  Patient knows her blood pressure is very high.  She takes lisinopril..  Review of Systems    Physical Exam  BP (!) 185/136 (BP Location: Right Arm)   Pulse 98   Temp 98.3 F (36.8 C) (Oral)   Resp 17   SpO2 100%  Gen:   Awake, no distress   Resp:  Normal effort  MSK:   Moves extremities without difficulty  Other:  Equal radial pulses.  Tachycardic.  Medical Decision Making  Medically screening exam initiated at 9:48 PM.  Appropriate orders placed.  Lisa Farley was informed that the remainder of the evaluation will be completed by another provider, this initial triage assessment does not replace that evaluation, and the importance of remaining in the ED until their evaluation is complete.  Concern for ACS, dissection, hypertensive urgency.  Appropriate workup ordered.   Lisa Horse T, DO 02/17/24 2148

## 2024-02-17 NOTE — ED Notes (Signed)
 Patient was seen in triage by Dr Florie Husband

## 2024-02-17 NOTE — ED Provider Notes (Signed)
 [place] EM[facility] AT [facility] Provider Note   CSN: [id number] Arrival date & time: 02/17/24  2114     History {Add pertinent medical, surgical, social history, OB history to HPI:1} Chief Complaint  Patient presents with   Hypertension    Lisa Farley is a 53 y.o. female.   Hypertension  Patient presents for***.  Medical history includes HTN, tobacco use, menorrhagia.  She has had recent elevated blood pressure, palpitations, headache, burning pain in her right arm.  Home medications include lisinopril, amlodipine.***     Home Medications Prior to Admission medications   Medication Sig Start Date End Date Taking? Authorizing Provider  dicyclomine  (BENTYL ) 20 MG tablet Take 1 tablet (20 mg total) by mouth 2 (two) times daily. 12/20/21   Autry, Lauren E, PA-C  gabapentin (NEURONTIN) 100 MG capsule Take 100 mg by mouth 3 (three) times daily as needed (leg pain).  06/15/16   [provider]  HYDROcodone -acetaminophen  (NORCO/VICODIN) 5-325 MG tablet Take 1 tablet by mouth every 4 (four) hours as needed. 02/19/19   Haviland, Julie, MD  levonorgestrel (MIRENA) 20 MCG/24HR IUD 1 each by Intrauterine route once. Inserted 02/13/2012    Gabrielle Joiner, MD  lisinopril-hydrochlorothiazide (PRINZIDE,ZESTORETIC) 20-25 MG per tablet Take 1 tablet by mouth daily.    [provider]  metroNIDAZOLE  (FLAGYL ) 500 MG tablet Take 1 tablet (500 mg total) by mouth 2 (two) times daily. Patient not taking: Reported on 09/15/2016 09/25/14   Gabrielle Joiner, MD  ondansetron  (ZOFRAN  ODT) 4 MG disintegrating tablet Take 1 tablet (4 mg total) by mouth every 8 (eight) hours as needed. 02/19/19   Sueellen Emery, MD  ondansetron  (ZOFRAN ) 4 MG tablet Take 1 tablet (4 mg total) by mouth every 6 (six) hours. 12/20/21   Autry, Lauren E, PA-C  oxyCODONE -acetaminophen  (PERCOCET) 10-325 MG tablet Take 1 tablet by mouth every 6 (six) hours as needed for pain.  08/19/16   [provider]  oxyCODONE -acetaminophen  (PERCOCET/ROXICET) 5-325 MG per tablet Take 2 tablets by mouth every 6 (six) hours as needed for severe pain. Patient not taking: Reported on 09/15/2016 01/19/14   Franceen Inches, NP  ranitidine (ZANTAC) 300 MG tablet Take 300 mg by mouth daily. 08/17/16   [provider]  tetrahydrozoline 0.05 % ophthalmic solution Place 1 drop into both eyes as needed (allergies).    [provider]      Allergies    Patient has no known allergies.    Review of Systems   Review of Systems  Physical Exam Updated Vital Signs BP (!) 185/136 (BP Location: Right Arm)   Pulse 98   Temp 98.3 F (36.8 C) (Oral)   Resp 17   SpO2 100%  Physical Exam  ED Results / Procedures / Treatments   Labs (all labs ordered are listed, but only abnormal results are displayed) Labs Reviewed  CBC WITH DIFFERENTIAL/PLATELET - Abnormal; Notable for the following components:      Result Value   Lymphs Abs 4.7 (*)    All other components within normal limits  COMPREHENSIVE METABOLIC PANEL WITH GFR - Abnormal; Notable for the following components:   Glucose, Bld 115 (*)    Creatinine, Ser 1.01 (*)    Total Protein 9.0 (*)    All other components within normal limits  I-STAT CHEM 8, ED - Abnormal; Notable for the following components:   Glucose, Bld 119 (*)    All other components within normal limits  CBG MONITORING,  ED - Abnormal; Notable for the following components:   Glucose-Capillary 123 (*)    All other components within normal limits  TROPONIN I (HIGH SENSITIVITY)  TROPONIN I (HIGH SENSITIVITY)    EKG None  Radiology No results found.  Procedures Procedures  {Document cardiac monitor, telemetry assessment procedure when appropriate:1}  Medications Ordered in ED Medications  iohexol  (OMNIPAQUE ) 350 MG/ML injection 75 mL (75 mLs Intravenous Contrast Given 02/17/24 2320)    ED Course/ Medical Decision Making/ A&P   {   Click here for ABCD2,  HEART and other calculatorsREFRESH Note before signing :1}                              Medical Decision Making  ***  {Document critical care time when appropriate:1} {Document review of labs and clinical decision tools ie heart score, Chads2Vasc2 etc:1}  {Document your independent review of radiology images, and any outside records:1} {Document your discussion with family members, caretakers, and with consultants:1} {Document social determinants of health affecting pt's care:1} {Document your decision making why or why not admission, treatments were needed:1} Final Clinical Impression(s) / ED Diagnoses Final diagnoses:  None    Rx / DC Orders ED Discharge Orders     None

## 2024-02-18 ENCOUNTER — Emergency Department (HOSPITAL_COMMUNITY)

## 2024-02-18 DIAGNOSIS — I10 Essential (primary) hypertension: Secondary | ICD-10-CM | POA: Diagnosis not present

## 2024-02-18 LAB — RAPID URINE DRUG SCREEN, HOSP PERFORMED
Amphetamines: NOT DETECTED
Barbiturates: NOT DETECTED
Benzodiazepines: NOT DETECTED
Cocaine: NOT DETECTED
Opiates: POSITIVE — AB
Tetrahydrocannabinol: NOT DETECTED

## 2024-02-18 LAB — URINALYSIS, ROUTINE W REFLEX MICROSCOPIC
Bilirubin Urine: NEGATIVE
Glucose, UA: NEGATIVE mg/dL
Hgb urine dipstick: NEGATIVE
Ketones, ur: NEGATIVE mg/dL
Leukocytes,Ua: NEGATIVE
Nitrite: NEGATIVE
Protein, ur: NEGATIVE mg/dL
Specific Gravity, Urine: 1.044 — ABNORMAL HIGH (ref 1.005–1.030)
pH: 5 (ref 5.0–8.0)

## 2024-02-18 LAB — TROPONIN I (HIGH SENSITIVITY)
Troponin I (High Sensitivity): 9 ng/L (ref <18)
Troponin I (High Sensitivity): 10 ng/L (ref <18)

## 2024-02-18 MED ORDER — AMLODIPINE BESYLATE 5 MG PO TABS
5.0000 mg | ORAL_TABLET | Freq: Once | ORAL | Status: AC
  Administered 2024-02-18: 5 mg via ORAL
  Filled 2024-02-18: qty 1

## 2024-02-18 MED ORDER — DIPHENHYDRAMINE HCL 50 MG/ML IJ SOLN
12.5000 mg | Freq: Once | INTRAMUSCULAR | Status: AC
  Administered 2024-02-18: 12.5 mg via INTRAVENOUS
  Filled 2024-02-18: qty 1

## 2024-02-18 MED ORDER — LACTATED RINGERS IV BOLUS
1000.0000 mL | Freq: Once | INTRAVENOUS | Status: AC
  Administered 2024-02-18: 1000 mL via INTRAVENOUS

## 2024-02-18 MED ORDER — ACETAMINOPHEN 325 MG PO TABS
650.0000 mg | ORAL_TABLET | Freq: Once | ORAL | Status: AC
  Administered 2024-02-18: 650 mg via ORAL
  Filled 2024-02-18: qty 2

## 2024-02-18 MED ORDER — NITROGLYCERIN 2 % TD OINT
1.0000 [in_us] | TOPICAL_OINTMENT | Freq: Once | TRANSDERMAL | Status: AC
  Administered 2024-02-18: 1 [in_us] via TOPICAL
  Filled 2024-02-18: qty 1

## 2024-02-18 MED ORDER — METOCLOPRAMIDE HCL 5 MG/ML IJ SOLN
10.0000 mg | Freq: Once | INTRAMUSCULAR | Status: AC
  Administered 2024-02-18: 10 mg via INTRAVENOUS
  Filled 2024-02-18: qty 2

## 2024-02-18 MED ORDER — ASPIRIN 81 MG PO CHEW: 324.0000 mg | CHEWABLE_TABLET | Freq: Once | ORAL | Status: DC

## 2024-02-18 MED ORDER — KETOROLAC TROMETHAMINE 15 MG/ML IJ SOLN
15.0000 mg | Freq: Once | INTRAMUSCULAR | Status: AC
  Administered 2024-02-18: 15 mg via INTRAVENOUS
  Filled 2024-02-18: qty 1

## 2024-02-18 MED ORDER — CLONIDINE HCL 0.1 MG PO TABS
0.1000 mg | ORAL_TABLET | Freq: Once | ORAL | Status: AC
  Administered 2024-02-18: 0.1 mg via ORAL
  Filled 2024-02-18: qty 1

## 2024-02-18 NOTE — Discharge Instructions (Addendum)
 Your test results today were reassuring.  Increase your home amlodipine dose to 10 mg daily.  Continue your other blood pressure medications.  Monitor your blood pressure at home and discuss further medication changes with your primary care doctor.  Return to the emergency department for any new or worsening symptoms of concern.

## 2024-06-24 LAB — COLOGUARD

## 2024-07-18 LAB — COLOGUARD
